# Patient Record
Sex: Male | Born: 1950
Health system: Southern US, Community
[De-identification: ages and names within clinical notes are randomized; demographics above are authoritative.]

## PROBLEM LIST (undated history)

## (undated) DIAGNOSIS — C4490 Unspecified malignant neoplasm of skin, unspecified: Secondary | ICD-10-CM

## (undated) DIAGNOSIS — K7689 Other specified diseases of liver: Secondary | ICD-10-CM

## (undated) DIAGNOSIS — I219 Acute myocardial infarction, unspecified: Secondary | ICD-10-CM

## (undated) DIAGNOSIS — E785 Hyperlipidemia, unspecified: Secondary | ICD-10-CM

## (undated) DIAGNOSIS — I2510 Atherosclerotic heart disease of native coronary artery without angina pectoris: Secondary | ICD-10-CM

## (undated) DIAGNOSIS — E663 Overweight: Secondary | ICD-10-CM

## (undated) DIAGNOSIS — M549 Dorsalgia, unspecified: Secondary | ICD-10-CM

## (undated) DIAGNOSIS — E291 Testicular hypofunction: Secondary | ICD-10-CM

## (undated) DIAGNOSIS — J189 Pneumonia, unspecified organism: Secondary | ICD-10-CM

## (undated) DIAGNOSIS — E119 Type 2 diabetes mellitus without complications: Secondary | ICD-10-CM

## (undated) HISTORY — DX: Other specified diseases of liver: K76.89

## (undated) HISTORY — DX: Hyperlipidemia, unspecified: E78.5

## (undated) HISTORY — DX: Unspecified malignant neoplasm of skin, unspecified: C44.90

## (undated) HISTORY — DX: Testicular hypofunction: E29.1

## (undated) HISTORY — DX: Dorsalgia, unspecified: M54.9

## (undated) HISTORY — PX: COLONOSCOPY: SHX174

## (undated) HISTORY — DX: Acute myocardial infarction, unspecified: I21.9

## (undated) HISTORY — DX: Overweight: E66.3

## (undated) HISTORY — DX: Type 2 diabetes mellitus without complications: E11.9

## (undated) HISTORY — PX: INGUINAL HERNIA REPAIR: SUR1180

## (undated) HISTORY — DX: Atherosclerotic heart disease of native coronary artery without angina pectoris: I25.10

## (undated) HISTORY — DX: Pneumonia, unspecified organism: J18.9

---

## 1999-10-17 DIAGNOSIS — I219 Acute myocardial infarction, unspecified: Secondary | ICD-10-CM

## 1999-10-17 HISTORY — DX: Acute myocardial infarction, unspecified: I21.9

## 2000-04-15 HISTORY — PX: OTHER SURGICAL HISTORY: SHX169

## 2000-10-16 DIAGNOSIS — C4490 Unspecified malignant neoplasm of skin, unspecified: Secondary | ICD-10-CM

## 2000-10-16 HISTORY — DX: Unspecified malignant neoplasm of skin, unspecified: C44.90

## 2003-03-03 ENCOUNTER — Encounter: Admission: RE | Admit: 2003-03-03 | Discharge: 2003-03-03 | Payer: Self-pay | Admitting: Pulmonary Disease

## 2003-03-03 ENCOUNTER — Encounter: Payer: Self-pay | Admitting: Pulmonary Disease

## 2004-11-08 ENCOUNTER — Ambulatory Visit: Payer: Self-pay | Admitting: Pulmonary Disease

## 2005-03-01 ENCOUNTER — Ambulatory Visit: Payer: Self-pay | Admitting: Pulmonary Disease

## 2005-12-08 ENCOUNTER — Ambulatory Visit: Payer: Self-pay | Admitting: Pulmonary Disease

## 2005-12-19 ENCOUNTER — Ambulatory Visit: Payer: Self-pay | Admitting: Gastroenterology

## 2005-12-25 ENCOUNTER — Ambulatory Visit: Payer: Self-pay | Admitting: Gastroenterology

## 2006-01-01 ENCOUNTER — Ambulatory Visit: Payer: Self-pay | Admitting: Gastroenterology

## 2006-01-08 ENCOUNTER — Ambulatory Visit: Payer: Self-pay | Admitting: Gastroenterology

## 2006-12-07 ENCOUNTER — Ambulatory Visit: Payer: Self-pay | Admitting: Pulmonary Disease

## 2007-03-25 ENCOUNTER — Ambulatory Visit: Payer: Self-pay | Admitting: Pulmonary Disease

## 2008-03-27 ENCOUNTER — Encounter: Payer: Self-pay | Admitting: Pulmonary Disease

## 2008-03-30 DIAGNOSIS — I2510 Atherosclerotic heart disease of native coronary artery without angina pectoris: Secondary | ICD-10-CM | POA: Insufficient documentation

## 2008-03-30 DIAGNOSIS — E785 Hyperlipidemia, unspecified: Secondary | ICD-10-CM | POA: Insufficient documentation

## 2008-03-30 DIAGNOSIS — K760 Fatty (change of) liver, not elsewhere classified: Secondary | ICD-10-CM | POA: Insufficient documentation

## 2008-04-09 ENCOUNTER — Ambulatory Visit: Payer: Self-pay | Admitting: Pulmonary Disease

## 2008-04-11 DIAGNOSIS — R7989 Other specified abnormal findings of blood chemistry: Secondary | ICD-10-CM

## 2008-04-11 DIAGNOSIS — E663 Overweight: Secondary | ICD-10-CM | POA: Insufficient documentation

## 2008-07-21 ENCOUNTER — Encounter: Payer: Self-pay | Admitting: Pulmonary Disease

## 2008-07-28 ENCOUNTER — Ambulatory Visit: Payer: Self-pay | Admitting: Pulmonary Disease

## 2008-07-28 DIAGNOSIS — M545 Low back pain: Secondary | ICD-10-CM

## 2008-10-05 ENCOUNTER — Telehealth (INDEPENDENT_AMBULATORY_CARE_PROVIDER_SITE_OTHER): Payer: Self-pay | Admitting: *Deleted

## 2009-06-15 ENCOUNTER — Ambulatory Visit: Payer: Self-pay | Admitting: Pulmonary Disease

## 2010-03-18 ENCOUNTER — Telehealth: Payer: Self-pay | Admitting: Pulmonary Disease

## 2010-06-14 ENCOUNTER — Ambulatory Visit: Payer: Self-pay | Admitting: Pulmonary Disease

## 2010-06-14 DIAGNOSIS — M25519 Pain in unspecified shoulder: Secondary | ICD-10-CM

## 2010-06-15 ENCOUNTER — Encounter: Payer: Self-pay | Admitting: Pulmonary Disease

## 2010-07-04 ENCOUNTER — Ambulatory Visit: Payer: Self-pay | Admitting: Internal Medicine

## 2010-07-11 ENCOUNTER — Telehealth (INDEPENDENT_AMBULATORY_CARE_PROVIDER_SITE_OTHER): Payer: Self-pay | Admitting: *Deleted

## 2010-11-08 ENCOUNTER — Ambulatory Visit
Admission: RE | Admit: 2010-11-08 | Discharge: 2010-11-08 | Payer: Self-pay | Source: Home / Self Care | Attending: Pulmonary Disease | Admitting: Pulmonary Disease

## 2010-11-15 NOTE — Assessment & Plan Note (Signed)
Summary: Acute NP office visit - sinus inf   CC:  pain  in left sinuses/teeth/ear, HA, and PND with runny green mucus x4days.  History of Present Illness: 60 year old male with known history of CAD, DM and Hyperlipidemia   60 y/o WM here for a follow up visit... he has multiple medical problems as noted below...    ~  Aug10:  he states that he has been doing great over the last year- "not even a cold"... he has no new complaints or concerns today...    ~  2010-07-11:  he has noted some left shoulder and arm pain w/ decr ROM; but Aleve helps & rec to use regular dosing + heat> offered Ortho referral for further eval when he is ready... he is overweight but lost 8# on diet & exerc program recently- waliking, yard work, Catering manager bike, Catering manager... he remains on ASA/ Plavix/ Aten & denies angina, palpit, SOB, etc... he hasn't seen Cards since 2005 & we will arrange for f/u check...  July 04, 2010 --Presents for an acute office visit. Complains of 4 days of sinus congestion , pain, pressure., teeth pain, ear pressure, headache, fever of 101. Denies chest pain,  orthopnea, hemoptysis,  n/v/d, edema, headache., neck stiffness. exposed recently to URI by PT in rehab.  Cold meds not helping.     Medications Prior to Update: 1)  Aspirin Adult Low Strength 81 Mg  Tbec (Aspirin) .... Take 1 Tablet By Mouth Once A Day 2)  Plavix 75 Mg  Tabs (Clopidogrel Bisulfate) .... Take 1 Tablet By Mouth Once A Day 3)  Atenolol 50 Mg  Tabs (Atenolol) .... Take 1 Tablet By Mouth Once A Day 4)  Lipitor 80 Mg  Tabs (Atorvastatin Calcium) .... Take 1/2 Tablet By Mouth Once Daily 5)  Metformin Hcl 500 Mg Tabs (Metformin Hcl) .Marland Kitchen.. 1 By Mouth Twice A Day 6)  Glimepiride 2 Mg  Tabs (Glimepiride) .... Take 1/2  Tablet By Mouth Once A Day... 7)  Androgel 50 Mg/5gm Gel (Testosterone) .... Apply As Directed (One 5gm Packet Rubbed Into Skin Daily)  Current Medications (verified): 1)  Aspirin Adult Low Strength 81 Mg  Tbec  (Aspirin) .... Take 1 Tablet By Mouth Once A Day 2)  Plavix 75 Mg  Tabs (Clopidogrel Bisulfate) .... Take 1 Tablet By Mouth Once A Day 3)  Atenolol 50 Mg  Tabs (Atenolol) .... Take 1 Tablet By Mouth Once A Day 4)  Lipitor 80 Mg  Tabs (Atorvastatin Calcium) .... Take 1/2 Tablet By Mouth Once Daily 5)  Metformin Hcl 500 Mg Tabs (Metformin Hcl) .Marland Kitchen.. 1 By Mouth Twice A Day 6)  Glimepiride 2 Mg  Tabs (Glimepiride) .... Take 1/2  Tablet By Mouth Once A Day... 7)  Androgel 50 Mg/5gm Gel (Testosterone) .... Apply As Directed (One 5gm Packet Rubbed Into Skin Daily)  Allergies (verified): No Known Drug Allergies  Past History:  Past Medical History: Last updated: 07/11/2010 CAD (ICD-414.00) HYPERLIPIDEMIA (ICD-272.4) DIABETES MELLITUS (ICD-250.00) OVERWEIGHT (ICD-278.02) FATTY LIVER DISEASE (ICD-571.8) HYPOGONADISM, MALE (ICD-257.2) BACK PAIN (ICD-724.5)  Past Surgical History: Last updated: 2010-07-11 S/P right inguinal hernia repair  Family History: Last updated: Jul 11, 2010 Father died age 70 w/ DM complic- vasc dis/ amputation, coma. Mother died age 45 from alcoholism, stroke. No siblings  Social History: Last updated: 2010/07/11 Married, wife= Debbie, 84yrs. 1 son never smoked exercises daily by walking no caffeine use no alcohol use Employ: fork lift operator  Risk Factors: Smoking Status: never (07-11-2010)  Review of Systems      See HPI  Vital Signs:  Patient profile:   60 year old male Height:      72 inches Weight:      223.50 pounds BMI:     30.42 O2 Sat:      96 % on Room air Temp:     101.2 degrees F oral Pulse rate:   83 / minute BP sitting:   134 / 76  (left arm) Cuff size:   regular  Vitals Entered By: Boone Master CNA/MA (July 04, 2010 2:33 PM)  O2 Flow:  Room air CC: pain  in left sinuses/teeth/ear, HA, PND with runny green mucus x4days Is Patient Diabetic? Yes Comments Medications reviewed with patient Daytime contact number verified  with patient. Boone Master CNA/MA  July 04, 2010 2:33 PM    Physical Exam  Additional Exam:  WD, Overweight, 60 y/o WM in NAD... GENERAL:  Alert & oriented; pleasant & cooperative... HEENT:  [place]/AT, EOM-wnl, PERRLA, EACs-clear, TMs-wnl, NOSE-red, swollen mucosa, max sinus tenderness  THROAT-clear & wnl. NECK:  Supple w/ full ROM; no JVD; normal carotid impulses w/o bruits; no thyromegaly or nodules palpated; no lymphadenopathy. CHEST:  Clear to P & A; without wheezes/ rales/ or rhonchi. HEART:  Regular Rhythm; without murmurs/ rubs/ or gallops. ABDOMEN:  Soft & nontender; normal bowel sounds; no organomegaly or masses detected.  EXT: without deformities, mild arthritic changes; no varicose veins/ venous insuffic/ or edema.      Impression & Recommendations:  Problem # 1:  SINUSITIS, ACUTE (ICD-461.9)  Acute flare of sinusitis Plan Saline nasal rinses as needed  Tyenol as needed pain increaes fluids and rest.  Zpack take as directed.  Please contact office for sooner follow up if symptoms do not improve or worsen  His updated medication list for this problem includes:    Zithromax 500 Mg Tabs (Azithromycin) .Marland Kitchen... 1 by mouth once daily  Orders: Est. Patient Level IV (06269)  Medications Added to Medication List This Visit: 1)  Zithromax 500 Mg Tabs (Azithromycin) .Marland Kitchen.. 1 by mouth once daily  Complete Medication List: 1)  Aspirin Adult Low Strength 81 Mg Tbec (Aspirin) .... Take 1 tablet by mouth once a day 2)  Plavix 75 Mg Tabs (Clopidogrel bisulfate) .... Take 1 tablet by mouth once a day 3)  Atenolol 50 Mg Tabs (Atenolol) .... Take 1 tablet by mouth once a day 4)  Lipitor 80 Mg Tabs (Atorvastatin calcium) .... Take 1/2 tablet by mouth once daily 5)  Metformin Hcl 500 Mg Tabs (Metformin hcl) .Marland Kitchen.. 1 by mouth twice a day 6)  Glimepiride 2 Mg Tabs (Glimepiride) .... Take 1/2  tablet by mouth once a day... 7)  Androgel 50 Mg/5gm Gel (Testosterone) .... Apply as directed  (one 5gm packet rubbed into skin daily) 8)  Zithromax 500 Mg Tabs (Azithromycin) .Marland Kitchen.. 1 by mouth once daily  Patient Instructions: 1)  Saline nasal rinses as needed  2)  Tyenol as needed pain 3)  increaes fluids and rest.  4)  Zpack take as directed.  5)  Please contact office for sooner follow up if symptoms do not improve or worsen  Prescriptions: ZITHROMAX 500 MG TABS (AZITHROMYCIN) 1 by mouth once daily  #5 x 0   Entered and Authorized by:   Rubye Oaks NP   Signed by:   Tammy Parrett NP on 07/04/2010   Method used:   Electronically to        [facility]  [address] [phone] * (retail)       [address]       [place], [place]  [address]       Ph: [phone] or [phone]       Fax: [phone]   RxID:   [id number]

## 2010-11-15 NOTE — Progress Notes (Signed)
Summary: rx  Phone Note Call from Patient Call back at Home Phone [phone]   Caller: Patient Call For: Symone Cornman Reason for Call: Talk to Nurse Summary of Call: Andrio gel 5g needs to be called in to [facility]. Initial call taken by: Eugene Gavia,  March 18, 2010 2:47 PM  Follow-up for Phone Call        rx pritned and placed on SN look-at to sign. will fax once signed. Carron Curie CMA  March 18, 2010 3:01 PM  rx faxed. pt advised. Carron Curie CMA  March 18, 2010 3:18 PM     Prescriptions: ANDROGEL 50 MG/5GM GEL (TESTOSTERONE) apply as directed (one 5gm packet rubbed into skin daily)  #90 x 3   Entered by:   Carron Curie CMA   Authorized by:   Michele Mcalpine MD   Signed by:   Carron Curie CMA on 03/18/2010   Method used:   Printed then faxed to ...       [facility] education [facility])       [address] [address]       [place], [place]  [address]       Ph: [phone]       Fax: [phone]   RxID:   [id number]

## 2010-11-15 NOTE — Progress Notes (Signed)
Summary: no energy  Phone Note Call from Patient   Caller: Patient Call For: nadel Summary of Call: pt completed z pak  . have no energy would like to talk to nurse Initial call taken by: Rickard Patience,  July 11, 2010 3:48 PM  Follow-up for Phone Call        Spoke with pt's spouse.  She states that pt was seen on 9/19 by TP for sinus infection and txed with zithromax.  She states that all of his sinus problems resolved, but now c/o no energy at all.  She states that he has not left his home in 1 wk and this is not like him.  Would like recs per SN.  Pt was seen by SN on 06/14/10 for cpx.  Pls advise thanks Follow-up by: Vernie Murders,  July 11, 2010 4:02 PM  Additional Follow-up for Phone Call Additional follow up Details #1::        per SN----all labs were normal on 06/14/2010       it will just take some time---recs are to rest at home, increase fluids, slowly increase activity.  thanks Randell Loop CMA  July 11, 2010 4:31 PM     Additional Follow-up for Phone Call Additional follow up Details #2::    Spoke with pt's spouse and notified of recs per SN.  She verbalized understanding. Follow-up by: Vernie Murders,  July 11, 2010 4:44 PM

## 2010-11-15 NOTE — Assessment & Plan Note (Signed)
Summary: rov 1 yr ////kp   CC:  Yearly ROV & CPX....  History of Present Illness: 60 y/o WM here for a follow up visit... he has multiple medical problems as noted below...    ~  Aug10:  he states that he has been doing great over the last year- "not even a cold"... he has no new complaints or concerns today...    ~  June 14, 2010:  he has noted some left shoulder and arm pain w/ decr ROM; but Aleve helps & rec to use regular dosing + heat> offered Ortho referral for further eval when he is ready... he is overweight but lost 8# on diet & exerc program recently- waliking, yard work, Catering manager bike, Catering manager... he remains on ASA/ Plavix/ Aten & denies angina, palpit, SOB, etc... he hasn't seen Cards since 2005 & we will arrange for f/u check...   Current Problem List:  CAD (ICD-414.00) - presented w/ CP in 2001 w/ IWMI and cath showing a 90% RCA lesion- s/p PTCA/ stent... he also had some mild LAD dis 20-30% at that time... seen 2005 by DrCrenshaw... he is on ASA 81mg /d, PLAVIX 75mg /d, & ATENOLOL 50mg /d...  ~  NuclearStressTest 7/05 showed inferolat infarct, no ischemia, impaired exerc capacity, EF=60%.   ~  8/10:  doing well- denies CP, palpit, SOB... walking daily w/o problems...  ~  8/11: he remains asymptomatic but I have encouraged Cards f/u appt.  HYPERLIPIDEMIA (ICD-272.4) - on LIPITOR 80mg - taking 1/2 tab daily... Labs all done at [facility]>  ~  FLP 6/08 showed TChol 142, TG 207, HDL 32, LDL 69... told to work on diet & weight...  ~  FLP 6/09 at [facility] showed TChol 140, TG 144, HDL 32, LDL 79... improved, continue Lipitor + diet.  ~  FLP 8/10 at [facility] showed TChol 145, TG 167, HDL 33, LDL 79  ~  FLP 8/11 at [facility] showed TChol =   DIABETES MELLITUS (ICD-250.00) - on METFORMIN 500mg Bid, and GLIMEPIRIDE 2mg /d...   ~  labs 6/08 showed BS= 161, HgA1c= not done by [facility]...  ~  labs 6/09 showed BS= 90, HgA1c= 6.0.Marland Kitchen. stay on diet, and decr the Gliepiride to 1/2 tab Qam...  ~  8/10:  states  BS at home are "OK"; labs at [facility] showed BS= 94, A1c not done.  ~  8/11: states BS are OK when he checks them, [facility] labs showed BS=   OVERWEIGHT (ICD-278.02) - we reviewed diet+exercise needed for weight reduction.  ~  weight in the 2000 decade varied betw 225-250.Marland Kitchen.  ~  weight 6/09 = 231#  ~  weight 8/10 = 237#  ~  weight 8/11 = 229#  COLONOSCOPY - we will check old chart for details...  FATTY LIVER DISEASE (ICD-571.8) - LFT's sl elevated in the past...   ~  LFT's on labs since 2008 have been WNL...  HYPOGONADISM, MALE (ICD-257.2) - he has atrophic testes and low testosterone levels... on Rx w/ ANDROGEL 5gm/d rubbed into skin as directed... testosterone level 177 (350-900) in 2007... we haven't been able to get a [facility] f/u level since then, but clinically he feels much better, energy level is good... he also notes ED> but not using PDE5 inhibitors etc...  ~  Labs at [facility] 8/10 showed Testosterone = 171 (280-800) & rec to use the Angdrogel daily; PSA= 0.4  ~  labs at [facility] 8/11 showed Testosterone level =   Health Maintenance - s/p Tetanus shot in 2008... s/p Pneumovax 6/09... he is  a never smoker... quit Etoh in the 70's... works as a Lobbyist in a [employer]...    Preventive Screening-Counseling & Management  Alcohol-Tobacco     Smoking Status: never  Allergies (verified): No Known Drug Allergies  Comments:  Nurse/Medical Assistant: The patient's medications and allergies were reviewed with the patient and were updated in the Medication and Allergy Lists.  Past History:  Past Medical History: CAD (ICD-414.00) HYPERLIPIDEMIA (ICD-272.4) DIABETES MELLITUS (ICD-250.00) OVERWEIGHT (ICD-278.02) FATTY LIVER DISEASE (ICD-571.8) HYPOGONADISM, MALE (ICD-257.2) BACK PAIN (ICD-724.5)  Past Surgical History: S/P right inguinal hernia repair  Family History: Reviewed history from 06/15/2009 and no changes required. Father died age 70 w/ DM complic- vasc dis/  amputation, coma. Mother died age 66 from alcoholism, stroke. No siblings  Social History: Reviewed history from 06/15/2009 and no changes required. Married, wife= Debbie, 17yrs. 1 son never smoked exercises daily by walking no caffeine use no alcohol use Employ: fork lift operator  Review of Systems       The patient complains of joint pain and stiffness.  The patient denies fever, chills, sweats, anorexia, fatigue, weakness, malaise, weight loss, sleep disorder, blurring, diplopia, eye irritation, eye discharge, vision loss, eye pain, photophobia, earache, ear discharge, tinnitus, decreased hearing, nasal congestion, nosebleeds, sore throat, hoarseness, chest pain, palpitations, syncope, dyspnea on exertion, orthopnea, PND, peripheral edema, cough, dyspnea at rest, excessive sputum, hemoptysis, wheezing, pleurisy, nausea, vomiting, diarrhea, constipation, change in bowel habits, abdominal pain, melena, hematochezia, jaundice, gas/bloating, indigestion/heartburn, dysphagia, odynophagia, dysuria, hematuria, urinary frequency, urinary hesitancy, nocturia, incontinence, back pain, joint swelling, muscle cramps, muscle weakness, arthritis, sciatica, restless legs, leg pain at night, leg pain with exertion, rash, itching, dryness, suspicious lesions, paralysis, paresthesias, seizures, tremors, vertigo, transient blindness, frequent falls, frequent headaches, difficulty walking, depression, anxiety, memory loss, confusion, cold intolerance, heat intolerance, polydipsia, polyphagia, polyuria, unusual weight change, abnormal bruising, bleeding, enlarged lymph nodes, urticaria, allergic rash, hay fever, and recurrent infections.    Vital Signs:  Patient profile:   60 year old male Height:      72 inches Weight:      229.25 pounds BMI:     31.20 O2 Sat:      100 % on Room air Temp:     97.2 degrees F oral Pulse rate:   69 / minute BP sitting:   110 / 70  (left arm) Cuff size:   regular  Vitals  Entered By: Randell Loop CMA (June 14, 2010 8:48 AM)  O2 Sat at Rest %:  100 O2 Flow:  Room air CC: Yearly ROV & CPX... Is Patient Diabetic? Yes Pain Assessment Patient in pain? yes      Onset of pain  left shoulder x 3-4 month Comments no changes in meds today   Physical Exam  Additional Exam:  WD, Overweight, 60 y/o WM in NAD... GENERAL:  Alert & oriented; pleasant & cooperative... HEENT:  [place]/AT, EOM-wnl, PERRLA, EACs-clear, TMs-wnl, NOSE-clear, THROAT-clear & wnl. NECK:  Supple w/ full ROM; no JVD; normal carotid impulses w/o bruits; no thyromegaly or nodules palpated; no lymphadenopathy. CHEST:  Clear to P & A; without wheezes/ rales/ or rhonchi. HEART:  Regular Rhythm; without murmurs/ rubs/ or gallops. ABDOMEN:  Soft & nontender; normal bowel sounds; no organomegaly or masses detected. RECTAL:  Neg - prostate 2+ rounded & nontender w/o nodules; stool hematest neg; atophic testes bilat... EXT: without deformities, mild arthritic changes; no varicose veins/ venous insuffic/ or edema. decr ROM left shoulder noted... NEURO:  CN's intact; motor  testing normal; sensory testing normal; gait normal & balance OK. DERM:  mild intertrig rash noted...    MISC. Report  Procedure date:  06/14/2010  Findings:      He will have fasting labs done at [facility] & fax the results to [place] to review...  SN   Impression & Recommendations:  Problem # 1:  PHYSICAL EXAMINATION (ICD-V70.0)  Problem # 2:  CAD (ICD-414.00) He hasn't seen Cards in 6 yrs and needs f/u eval... His updated medication list for this problem includes:    Aspirin Adult Low Strength 81 Mg Tbec (Aspirin) .Marland Kitchen... Take 1 tablet by mouth once a day    Plavix 75 Mg Tabs (Clopidogrel bisulfate) .Marland Kitchen... Take 1 tablet by mouth once a day    Atenolol 50 Mg Tabs (Atenolol) .Marland Kitchen... Take 1 tablet by mouth once a day  Orders: Cardiology Referral (Cardiology)  Problem # 3:  HYPERLIPIDEMIA (ICD-272.4) FLP from [facility] have been good  on Lip40... continue same + better diet, get wt down. His updated medication list for this problem includes:    Lipitor 80 Mg Tabs (Atorvastatin calcium) .Marland Kitchen... Take 1/2 tablet by mouth once daily  Problem # 4:  DIABETES MELLITUS (ICD-250.00) He is stable on meds... needs weight reduction program, etc... His updated medication list for this problem includes:    Aspirin Adult Low Strength 81 Mg Tbec (Aspirin) .Marland Kitchen... Take 1 tablet by mouth once a day    Metformin Hcl 500 Mg Tabs (Metformin hcl) .Marland Kitchen... 1 by mouth twice a day    Glimepiride 2 Mg Tabs (Glimepiride) .Marland Kitchen... Take 1/2  tablet by mouth once a day...  Problem # 5:  OVERWEIGHT (ICD-278.02) Weight loss is key...  Problem # 6:  HYPOGONADISM, MALE (ICD-257.2) He remains on Androgel> feeling well, energy good, despite sl low Testos level when checked... OK to continue as is.  Problem # 7:  BACK PAIN (ICD-724.5) We discussed rest, heat, OTC anti-inflamm Rx...refer to Ortho if nec. His updated medication list for this problem includes:    Aspirin Adult Low Strength 81 Mg Tbec (Aspirin) .Marland Kitchen... Take 1 tablet by mouth once a day  Complete Medication List: 1)  Aspirin Adult Low Strength 81 Mg Tbec (Aspirin) .... Take 1 tablet by mouth once a day 2)  Plavix 75 Mg Tabs (Clopidogrel bisulfate) .... Take 1 tablet by mouth once a day 3)  Atenolol 50 Mg Tabs (Atenolol) .... Take 1 tablet by mouth once a day 4)  Lipitor 80 Mg Tabs (Atorvastatin calcium) .... Take 1/2 tablet by mouth once daily 5)  Metformin Hcl 500 Mg Tabs (Metformin hcl) .Marland Kitchen.. 1 by mouth twice a day 6)  Glimepiride 2 Mg Tabs (Glimepiride) .... Take 1/2  tablet by mouth once a day... 7)  Androgel 50 Mg/5gm Gel (Testosterone) .... Apply as directed (one 5gm packet rubbed into skin daily)  Other Orders: Orthopedic Referral (Ortho)  Patient Instructions: 1)  Today we updated your med list- see below.... 2)  We refilled your meds for 90d supplies.Marland KitchenMarland Kitchen 3)  Please have your FASTING blood  work done at [facility] & fax'd to [place] to review... we will record a phone tree message w/ your results.Marland KitchenMarland Kitchen 4)  Let's get on track w/ our diet + exercise program w/ a goal of losing 15-20 lbs... 5)  You are overdue for a follow up appt w/ Cardiology 7 wee will set this up for you... 6)  Call for any problems.Marland KitchenMarland Kitchen 7)  Please schedule a follow-up appointment in 1 year.  Prescriptions: ANDROGEL 50 MG/5GM GEL (TESTOSTERONE) apply as directed (one 5gm packet rubbed into skin daily)  #90 x 4   Entered and Authorized by:   Michele Mcalpine MD   Signed by:   Michele Mcalpine MD on 06/14/2010   Method used:   Print then Give to Patient   RxID:   [id number] GLIMEPIRIDE 2 MG  TABS (GLIMEPIRIDE) Take 1/2  tablet by mouth once a day...  #90 x 4   Entered and Authorized by:   Michele Mcalpine MD   Signed by:   Michele Mcalpine MD on 06/14/2010   Method used:   Print then Give to Patient   RxID:   [id number] METFORMIN HCL 500 MG TABS (METFORMIN HCL) 1 by mouth twice a day  #180 x 4   Entered and Authorized by:   Michele Mcalpine MD   Signed by:   Michele Mcalpine MD on 06/14/2010   Method used:   Print then Give to Patient   RxID:   [id number] LIPITOR 80 MG  TABS (ATORVASTATIN CALCIUM) take 1/2 tablet by mouth once daily  #90 x 4   Entered and Authorized by:   Michele Mcalpine MD   Signed by:   Michele Mcalpine MD on 06/14/2010   Method used:   Print then Give to Patient   RxID:   [id number] ATENOLOL 50 MG  TABS (ATENOLOL) Take 1 tablet by mouth once a day  #90 x 4   Entered and Authorized by:   Michele Mcalpine MD   Signed by:   Michele Mcalpine MD on 06/14/2010   Method used:   Print then Give to Patient   RxID:   [id number] PLAVIX 75 MG  TABS (CLOPIDOGREL BISULFATE) Take 1 tablet by mouth once a day  #90 x 4   Entered and Authorized by:   Michele Mcalpine MD   Signed by:   Michele Mcalpine MD on 06/14/2010   Method used:   Print then Give to Patient   RxID:   [id number]    Immunization  History:  Influenza Immunization History:    Influenza:  historical (08/05/2009)

## 2010-11-17 NOTE — Assessment & Plan Note (Signed)
Summary: Acute NP office visit - bronchitis   CC:  dry hacking cough x10days - went to UC at onset of symptoms and was given zpak, mucinex dm and tussin syrup.  states symptoms exacerbated 3 days ago w/ cough occ producing yellow mucus, bilateral ear disocmfort.  denies wheezing, SOB, and f/c/s.  History of Present Illness: 60 year old male with known history of CAD, DM and Hyperlipidemia   ~  Aug10:  he states that he has been doing great over the last year- "not even a cold"... he has no new complaints or concerns today...    ~  June 14, 2010:  he has noted some left shoulder and arm pain w/ decr ROM; but Aleve helps & rec to use regular dosing + heat> offered Ortho referral for further eval when he is ready... he is overweight but lost 8# on diet & exerc program recently- waliking, yard work, Catering manager bike, Catering manager... he remains on ASA/ Plavix/ Aten & denies angina, palpit, SOB, etc... he hasn't seen Cards since 2005 & we will arrange for f/u check...  July 04, 2010 --Presents for an acute office visit. Complains of 4 days of sinus congestion , pain, pressure., teeth pain, ear pressure, headache, fever of 101. Denies chest pain,  orthopnea, hemoptysis,  n/v/d, edema, headache., neck stiffness. exposed recently to URI by PT in rehab.  Cold meds not helping.>>tx w/ zpack  November 08, 2010--Presents for an acute office visit. Complains of dry hacking cough x10days - went to UC at onset of symptoms and was given zpak, mucinex dm and tussin syrup.  Improved some but  symptoms exacerbated 3 days ago w/ cough occ producing yellow mucus, bilateral ear disocmfort.  Wife has had similar symptoms.Denies chest pain,  orthopnea, hemoptysis, fever, n/v/d, edema, headache.    Medications Prior to Update: 1)  Aspirin Adult Low Strength 81 Mg  Tbec (Aspirin) .... Take 1 Tablet By Mouth Once A Day 2)  Plavix 75 Mg  Tabs (Clopidogrel Bisulfate) .... Take 1 Tablet By Mouth Once A Day 3)  Atenolol 50 Mg  Tabs  (Atenolol) .... Take 1 Tablet By Mouth Once A Day 4)  Lipitor 80 Mg  Tabs (Atorvastatin Calcium) .... Take 1/2 Tablet By Mouth Once Daily 5)  Metformin Hcl 500 Mg Tabs (Metformin Hcl) .Marland Kitchen.. 1 By Mouth Twice A Day 6)  Glimepiride 2 Mg  Tabs (Glimepiride) .... Take 1/2  Tablet By Mouth Once A Day... 7)  Androgel 50 Mg/5gm Gel (Testosterone) .... Apply As Directed (One 5gm Packet Rubbed Into Skin Daily) 8)  Zithromax 500 Mg Tabs (Azithromycin) .Marland Kitchen.. 1 By Mouth Once Daily  Current Medications (verified): 1)  Aspirin Adult Low Strength 81 Mg  Tbec (Aspirin) .... Take 1 Tablet By Mouth Once A Day 2)  Plavix 75 Mg  Tabs (Clopidogrel Bisulfate) .... Take 1 Tablet By Mouth Once A Day 3)  Atenolol 50 Mg  Tabs (Atenolol) .... Take 1 Tablet By Mouth Once A Day 4)  Lipitor 80 Mg  Tabs (Atorvastatin Calcium) .... Take 1/2 Tablet By Mouth Once Daily 5)  Metformin Hcl 500 Mg Tabs (Metformin Hcl) .Marland Kitchen.. 1 By Mouth Twice A Day 6)  Glimepiride 2 Mg  Tabs (Glimepiride) .... Take 1/2  Tablet By Mouth Once A Day... 7)  Androgel 50 Mg/5gm Gel (Testosterone) .... Apply As Directed (One 5gm Packet Rubbed Into Skin Daily)  Allergies (verified): No Known Drug Allergies  Past History:  Past Medical History: Last updated: 06/14/2010 CAD (ICD-414.00) HYPERLIPIDEMIA (  ICD-272.4) DIABETES MELLITUS (ICD-250.00) OVERWEIGHT (ICD-278.02) FATTY LIVER DISEASE (ICD-571.8) HYPOGONADISM, MALE (ICD-257.2) BACK PAIN (ICD-724.5)  Past Surgical History: Last updated: 2010/06/24 S/P right inguinal hernia repair  Family History: Last updated: 2010-06-24 Father died age 26 w/ DM complic- vasc dis/ amputation, coma. Mother died age 55 from alcoholism, stroke. No siblings  Social History: Last updated: 06-24-2010 Married, wife= Debbie, 25yrs. 1 son never smoked exercises daily by walking no caffeine use no alcohol use Employ: fork lift operator  Risk Factors: Smoking Status: never (2010/06/24)  Review of Systems       See HPI  Vital Signs:  Patient profile:   60 year old male Height:      72 inches Weight:      233.38 pounds BMI:     31.77 O2 Sat:      94 % on Room air Temp:     97.7 degrees F oral Pulse rate:   69 / minute BP sitting:   108 / 64  (left arm) Cuff size:   regular  Vitals Entered By: Boone Master CNA/MA (November 08, 2010 2:01 PM)  O2 Flow:  Room air CC: dry hacking cough x10days - went to UC at onset of symptoms and was given zpak, mucinex dm and tussin syrup.  states symptoms exacerbated 3 days ago w/ cough occ producing yellow mucus, bilateral ear disocmfort.  denies wheezing, SOB, f/c/s Is Patient Diabetic? Yes Comments Medications reviewed with patient Daytime contact number verified with patient. Boone Master CNA/MA  November 08, 2010 1:56 PM    Physical Exam  Additional Exam:  WD, Overweight, 60 y/o WM in NAD... GENERAL:  Alert & oriented; pleasant & cooperative... HEENT:  [place]/AT, EOM-wnl, PERRLA, EACs-clear, TMs-wnl, NOSE clear drainage   THROAT-clear & wnl. NECK:  Supple w/ full ROM; no JVD; normal carotid impulses w/o bruits; no thyromegaly or nodules palpated; no lymphadenopathy. CHEST:  Clear to P & A; without wheezes/ rales/ or rhonchi. HEART:  Regular Rhythm; without murmurs/ rubs/ or gallops. ABDOMEN:  Soft & nontender; normal bowel sounds; no organomegaly or masses detected.  EXT: without deformities, mild arthritic changes; no varicose veins/ venous insuffic/ or edema.      Impression & Recommendations:  Problem # 1:  BRONCHITIS, ACUTE (ICD-466.0)  Slow to resolve  Plan:  Omnicef 300mg  two times a day for 7 days Mucinex DM two times a day as needed cough/congestion  Hydromet 1-2 tsp every 4-6 hrs as needed cough--may make you sleepy Prednisone taper over next week.  Increase fluids and rest  Please contact office for sooner follow up if symptoms do not improve or worsen  The following medications were removed from the medication list:    Zithromax  500 Mg Tabs (Azithromycin) .Marland Kitchen... 1 by mouth once daily His updated medication list for this problem includes:    Cefdinir 300 Mg Caps (Cefdinir) .Marland Kitchen... 1 by mouth two times a day    Hydromet 5-1.5 Mg/20ml Syrp (Hydrocodone-homatropine) .Marland Kitchen... 1-2 tsp every 4-6 hrs as needed cough  Orders: Est. Patient Level IV (04540)  Medications Added to Medication List This Visit: 1)  Cefdinir 300 Mg Caps (Cefdinir) .Marland Kitchen.. 1 by mouth two times a day 2)  Prednisone 10 Mg Tabs (Prednisone) .... 4 tabs for 2 days, then 3 tabs for 2 days, 2 tabs for 2 days, then 1 tab for 2 days, then stop 3)  Hydromet 5-1.5 Mg/66ml Syrp (Hydrocodone-homatropine) .Marland Kitchen.. 1-2 tsp every 4-6 hrs as needed cough  Complete Medication List: 1)  Aspirin  Adult Low Strength 81 Mg Tbec (Aspirin) .... Take 1 tablet by mouth once a day 2)  Plavix 75 Mg Tabs (Clopidogrel bisulfate) .... Take 1 tablet by mouth once a day 3)  Atenolol 50 Mg Tabs (Atenolol) .... Take 1 tablet by mouth once a day 4)  Lipitor 80 Mg Tabs (Atorvastatin calcium) .... Take 1/2 tablet by mouth once daily 5)  Metformin Hcl 500 Mg Tabs (Metformin hcl) .Marland Kitchen.. 1 by mouth twice a day 6)  Glimepiride 2 Mg Tabs (Glimepiride) .... Take 1/2  tablet by mouth once a day... 7)  Androgel 50 Mg/5gm Gel (Testosterone) .... Apply as directed (one 5gm packet rubbed into skin daily) 8)  Cefdinir 300 Mg Caps (Cefdinir) .Marland Kitchen.. 1 by mouth two times a day 9)  Prednisone 10 Mg Tabs (Prednisone) .... 4 tabs for 2 days, then 3 tabs for 2 days, 2 tabs for 2 days, then 1 tab for 2 days, then stop 10)  Hydromet 5-1.5 Mg/30ml Syrp (Hydrocodone-homatropine) .Marland Kitchen.. 1-2 tsp every 4-6 hrs as needed cough  Patient Instructions: 1)  Omnicef 300mg  two times a day for 7 days 2)  Mucinex DM two times a day as needed cough/congestion  3)  Hydromet 1-2 tsp every 4-6 hrs as needed cough--may make you sleepy 4)  Prednisone taper over next week.  5)  Increase fluids and rest  6)  Please contact office for sooner  follow up if symptoms do not improve or worsen  Prescriptions: HYDROMET 5-1.5 MG/5ML SYRP (HYDROCODONE-HOMATROPINE) 1-2 tsp every 4-6 hrs as needed cough  #8 oz x 0   Entered and Authorized by:   Rubye Oaks NP   Signed by:   Miranda Frese NP on 11/08/2010   Method used:   Print then Give to Patient   RxID:   [id number] PREDNISONE 10 MG TABS (PREDNISONE) 4 tabs for 2 days, then 3 tabs for 2 days, 2 tabs for 2 days, then 1 tab for 2 days, then stop  #20 x 0   Entered and Authorized by:   Rubye Oaks NP   Signed by:   Thomasa Heidler NP on 11/08/2010   Method used:   Electronically to        [id number] (retail)       [address]       [place], [place]  [address]       Ph: [phone] or [phone]       Fax: [phone]   RxID:   [id number] CEFDINIR 300 MG CAPS (CEFDINIR) 1 by mouth two times a day  #14 x 0   Entered and Authorized by:   Rubye Oaks NP   Signed by:   Caryn Gienger NP on 11/08/2010   Method used:   [facility] to        [id number] (retail)       [address]       [place], [place]  [address]       Ph: [phone] or [phone]       Fax: [phone]   RxID:   [id number]    Immunization History:  Influenza Immunization History:    Influenza:  historical (07/16/2010)

## 2010-11-21 ENCOUNTER — Ambulatory Visit (INDEPENDENT_AMBULATORY_CARE_PROVIDER_SITE_OTHER)
Admission: RE | Admit: 2010-11-21 | Discharge: 2010-11-21 | Disposition: A | Discharge status: Home / Self Care | Payer: 59 | Source: Ambulatory Visit | Attending: Pulmonary Disease | Admitting: Pulmonary Disease

## 2010-11-21 ENCOUNTER — Other Ambulatory Visit: Payer: Self-pay | Admitting: Pulmonary Disease

## 2010-11-21 ENCOUNTER — Ambulatory Visit (INDEPENDENT_AMBULATORY_CARE_PROVIDER_SITE_OTHER): Payer: 59 | Admitting: Adult Health

## 2010-11-21 ENCOUNTER — Encounter: Payer: Self-pay | Admitting: Adult Health

## 2010-11-21 DIAGNOSIS — J209 Acute bronchitis, unspecified: Secondary | ICD-10-CM

## 2010-11-24 ENCOUNTER — Encounter: Payer: Self-pay | Admitting: Adult Health

## 2010-11-29 ENCOUNTER — Telehealth (INDEPENDENT_AMBULATORY_CARE_PROVIDER_SITE_OTHER): Payer: Self-pay | Admitting: *Deleted

## 2010-12-01 NOTE — Assessment & Plan Note (Signed)
Summary: NP follow up - bronchitis   CC:  prod cough with green mucus and fatigue - states no better since last ov.  finished abx and pred taper 6days ago.  History of Present Illness: 60 year old male with known history of CAD, DM and Hyperlipidemia   ~  Aug10:  he states that he has been doing great over the last year- "not even a cold"... he has no new complaints or concerns today...    ~  June 14, 2010:  he has noted some left shoulder and arm pain w/ decr ROM; but Aleve helps & rec to use regular dosing + heat> offered Ortho referral for further eval when he is ready... he is overweight but lost 8# on diet & exerc program recently- waliking, yard work, Catering manager bike, Catering manager... he remains on ASA/ Plavix/ Aten & denies angina, palpit, SOB, etc... he hasn't seen Cards since 2005 & we will arrange for f/u check...  July 04, 2010 --Presents for an acute office visit. Complains of 4 days of sinus congestion , pain, pressure., teeth pain, ear pressure, headache, fever of 101. Denies chest pain,  orthopnea, hemoptysis,  n/v/d, edema, headache., neck stiffness. exposed recently to URI by PT in rehab.  Cold meds not helping.>>tx w/ zpack  November 08, 2010--Presents for an acute office visit. Complains of dry hacking cough x10days - went to UC at onset of symptoms and was given zpak, mucinex dm and tussin syrup.  Improved some but  symptoms exacerbated 3 days ago w/ cough occ producing yellow mucus, bilateral ear disocmfort.  Wife has had similar symptoms.Denies chest pain,  orthopnea, hemoptysis, fever, n/v/d, edema, headache.  11/21/10 --Returns for persistent cough. Complains of prod cough with green mucus, fatigue - states no better since last ov. Seen 2 weeks ago tx w/  Omnicef and sterod burst. He returns saying congestion is no discolroed but cough wheezing are no better. He has now had 2 abx without significant improvement.  Denies chest pain, orthopnea, hemoptysis, fever, n/v/d, edema, headache/    Medications Prior to Update: 1)  Aspirin Adult Low Strength 81 Mg  Tbec (Aspirin) .... Take 1 Tablet By Mouth Once A Day 2)  Plavix 75 Mg  Tabs (Clopidogrel Bisulfate) .... Take 1 Tablet By Mouth Once A Day 3)  Atenolol 50 Mg  Tabs (Atenolol) .... Take 1 Tablet By Mouth Once A Day 4)  Lipitor 80 Mg  Tabs (Atorvastatin Calcium) .... Take 1/2 Tablet By Mouth Once Daily 5)  Metformin Hcl 500 Mg Tabs (Metformin Hcl) .Marland Kitchen.. 1 By Mouth Twice A Day 6)  Glimepiride 2 Mg  Tabs (Glimepiride) .... Take 1/2  Tablet By Mouth Once A Day... 7)  Androgel 50 Mg/5gm Gel (Testosterone) .... Apply As Directed (One 5gm Packet Rubbed Into Skin Daily) 8)  Cefdinir 300 Mg Caps (Cefdinir) .Marland Kitchen.. 1 By Mouth Two Times A Day 9)  Prednisone 10 Mg Tabs (Prednisone) .... 4 Tabs For 2 Days, Then 3 Tabs For 2 Days, 2 Tabs For 2 Days, Then 1 Tab For 2 Days, Then Stop 10)  Hydromet 5-1.5 Mg/69ml Syrp (Hydrocodone-Homatropine) .Marland Kitchen.. 1-2 Tsp Every 4-6 Hrs As Needed Cough  Current Medications (verified): 1)  Aspirin Adult Low Strength 81 Mg  Tbec (Aspirin) .... Take 1 Tablet By Mouth Once A Day 2)  Plavix 75 Mg  Tabs (Clopidogrel Bisulfate) .... Take 1 Tablet By Mouth Once A Day 3)  Atenolol 50 Mg  Tabs (Atenolol) .... Take 1 Tablet By Mouth Once  A Day 4)  Lipitor 80 Mg  Tabs (Atorvastatin Calcium) .... Take 1/2 Tablet By Mouth Once Daily 5)  Metformin Hcl 500 Mg Tabs (Metformin Hcl) .Marland Kitchen.. 1 By Mouth Twice A Day 6)  Glimepiride 2 Mg  Tabs (Glimepiride) .... Take 1/2  Tablet By Mouth Once A Day... 7)  Androgel 50 Mg/5gm Gel (Testosterone) .... Apply As Directed (One 5gm Packet Rubbed Into Skin Daily) 8)  Hydromet 5-1.5 Mg/88ml Syrp (Hydrocodone-Homatropine) .Marland Kitchen.. 1-2 Tsp Every 4-6 Hrs As Needed Cough  Allergies (verified): No Known Drug Allergies  Past History:  Past Medical History: Last updated: 06/23/10 CAD (ICD-414.00) HYPERLIPIDEMIA (ICD-272.4) DIABETES MELLITUS (ICD-250.00) OVERWEIGHT (ICD-278.02) FATTY LIVER DISEASE  (ICD-571.8) HYPOGONADISM, MALE (ICD-257.2) BACK PAIN (ICD-724.5)  Past Surgical History: Last updated: Jun 23, 2010 S/P right inguinal hernia repair  Family History: Last updated: June 23, 2010 Father died age 61 w/ DM complic- vasc dis/ amputation, coma. Mother died age 65 from alcoholism, stroke. No siblings  Social History: Last updated: 06/23/2010 Married, wife= Debbie, 78yrs. 1 son never smoked exercises daily by walking no caffeine use no alcohol use Employ: fork lift operator  Risk Factors: Smoking Status: never (Jun 23, 2010)  Review of Systems      See HPI  Vital Signs:  Patient profile:   60 year old male Height:      72 inches Weight:      234.50 pounds BMI:     31.92 O2 Sat:      95 % on Room air Temp:     98.0 degrees F oral Pulse rate:   75 / minute BP sitting:   108 / 74  (left arm) Cuff size:   regular  Vitals Entered By: Boone Master CNA/MA (November 21, 2010 3:13 PM)  O2 Flow:  Room air CC: prod cough with green mucus, fatigue - states no better since last ov.  finished abx and pred taper 6days ago Is Patient Diabetic? Yes Comments Medications reviewed with patient Daytime contact number verified with patient. Boone Master CNA/MA  November 21, 2010 3:13 PM    Physical Exam  Additional Exam:  WD, Overweight, 60 y/o WM in NAD... GENERAL:  Alert & oriented; pleasant & cooperative... HEENT:  /AT, EOM-wnl, PERRLA, EACs-clear, TMs-wnl, NOSE clear drainage   THROAT-clear & wnl. NECK:  Supple w/ full ROM; no JVD; normal carotid impulses w/o bruits; no thyromegaly or nodules palpated; no lymphadenopathy. CHEST:  Coarse BS with no few exp wheezig , barking cough  HEART:  Regular Rhythm; without murmurs/ rubs/ or gallops. ABDOMEN:  Soft & nontender; normal bowel sounds; no organomegaly or masses detected.  EXT: without deformities, mild arthritic changes; no varicose veins/ venous insuffic/ or edema.      Impression & Recommendations:  Problem #  1:  BRONCHITIS, ACUTE (ICD-466.0) Slow to resolve flare  XRAy with no acute changes.  Paln:   Mucinex DM two times a day  Tessalon three times a day  Hydromet 1-2 tsp every 4-6 hrs as needed cough--may make you sleepy Zyrtec 10mg  at bedtime for 5days Prednisone taper over next week.  Increase fluids and rest  Please contact office for sooner follow up if symptoms do not improve or worsen  Increase Glimiperide 2mg  1 tab once daily  Check sugars daily , call if sugars>250.  The following medications were removed from the medication list:    Cefdinir 300 Mg Caps (Cefdinir) .Marland Kitchen... 1 by mouth two times a day His updated medication list for this problem includes:    Hydromet  5-1.5 Mg/29ml Syrp (Hydrocodone-homatropine) .Marland Kitchen... 1-2 tsp every 4-6 hrs as needed cough    Mucinex Dm 30-600 Mg Xr12h-tab (Dextromethorphan-guaifenesin) .Marland Kitchen... 1 by mouth two times a day    Tessalon 200 Mg Caps (Benzonatate) .Marland Kitchen... 1 by mouth three times a day as needed cough  Orders: T-2 View CXR (71020TC) Nebulizer Tx (16109) Albuterol Sulfate Sol 1mg  unit dose (U0454) Est. Patient Level IV (09811)  Medications Added to Medication List This Visit: 1)  Prednisone 10 Mg Tabs (Prednisone) .... 4 tabs for 2 days, then 3 tabs for 2 days, 2 tabs for 2 days, then 1 tab for 2 days, then stop 2)  Mucinex Dm 30-600 Mg Xr12h-tab (Dextromethorphan-guaifenesin) .Marland Kitchen.. 1 by mouth two times a day 3)  Tessalon 200 Mg Caps (Benzonatate) .Marland Kitchen.. 1 by mouth three times a day as needed cough  Patient Instructions: 1)   Mucinex DM two times a day  2)  Tessalon three times a day  3)  Hydromet 1-2 tsp every 4-6 hrs as needed cough--may make you sleepy 4)  Zyrtec 10mg  at bedtime for 5days 5)  Prednisone taper over next week.  6)  Increase fluids and rest  7)  Please contact office for sooner follow up if symptoms do not improve or worsen  8)  Increase Glimiperide 2mg  1 tab once daily  9)  Check sugars daily , call if sugars>250.  10)   follow up 2 weeks and as needed  Prescriptions: HYDROMET 5-1.5 MG/5ML SYRP (HYDROCODONE-HOMATROPINE) 1-2 tsp every 4-6 hrs as needed cough  #8 oz x 0   Entered and Authorized by:   Rubye Oaks NP   Signed by:   Tammy Parrett NP on 11/21/2010   Method used:   Print then Give to Patient   RxID:   [id number] TESSALON 200 MG CAPS (BENZONATATE) 1 by mouth three times a day as needed cough  #30 x 0   Entered and Authorized by:   Rubye Oaks NP   Signed by:   Tammy Parrett NP on 11/21/2010   Method used:   [facility]ron[facility] to        [id number] ([facility]       [address]       [place], [place]  [address]       Ph: [phone] or [phone]       Fax: [phone]   RxID:   [id number] MUCINEX DM 30-600 MG XR12H-TAB (DEXTROMETHORPHAN-GUAIFENESIN) 1 by mouth two times a day  #60 x 0   Entered and Authorized by:   Rubye Oaks NP   Signed by:   Tammy Parrett NP on 11/21/2010   Method used:   Print then Give to Patient   RxID:   [id number] PREDNISONE 10 MG TABS (PREDNISONE) 4 tabs for 2 days, then 3 tabs for 2 days, 2 tabs for 2 days, then 1 tab for 2 days, then stop  #20 x 0   Entered and Authorized by:   Rubye Oaks NP   Signed by:   Tammy Parrett NP on 11/21/2010   Method used:   [facility] to        [id number]       [address]       [place], [place]  [address]       Ph: [phone] or [phone]  Fax: [phone]   RxID:   [id number]    Medication Administration  Medication # 1:    Medication: Albuterol Sulfate Sol 1mg  unit dose    Diagnosis: BRONCHITIS, ACUTE (ICD-466.0)    Dose: 1 vial    Route: inhaled    Exp Date: 11-2011    Lot #: [id number]    Mfr: nephron    Patient tolerated medication without complications    Given by: Boone Master CNA/MA (November 21, 2010 4:01 PM)  Orders Added: 1)  T-2 View CXR [71020TC] 2)  Nebulizer Tx [53664] 3)  Albuterol Sulfate Sol  1mg  unit dose [J7613] 4)  Est. Patient Level IV [40347]

## 2010-12-01 NOTE — Letter (Signed)
Summary: [facility] Results Follow Up Letter  [facility]  [address]   [place], [place] [address]   Phone: [phone]  Fax: [phone]    11/24/2010 MRN: [id number]  Joe [name] [address] [place], [place]  [address]  [place]  Dear Mr. BEAUBRUN,  We have received the results from your recent tests and have been unable to contact you.  Please call our office at [phone] so that Rubye Oaks NP or her nurse may review the results with you.    Thank you,  Nature conservation officer Pulmonary Division

## 2010-12-07 NOTE — Progress Notes (Signed)
Summary: recvd letter regarding results  Phone Note Call from Patient   Caller: Patient Call For: [facility] P Summary of Call: patient phoned stated that he recieved a letter stating that we could not get in touch with him for his results. He can be reached at [phone] Initial call taken by: Vedia Coffer,  November 29, 2010 3:34 PM  Follow-up for Phone Call        Spoke with pt and notified of results/recs per append to cxr.  Pt verbalized understanding.  He states will need to call back to schedule followup.  Not able to make appt at this time.  Follow-up by: Vernie Murders,  November 29, 2010 3:43 PM

## 2011-01-17 ENCOUNTER — Telehealth: Payer: Self-pay | Admitting: Pulmonary Disease

## 2011-01-17 MED ORDER — TESTOSTERONE 50 MG/5GM (1%) TD GEL: 5.0000 g | Freq: Every day | TRANSDERMAL | Status: DC

## 2011-01-17 NOTE — Telephone Encounter (Signed)
[facility] Printed out rx and put on SN cart for him to sign  Carver Fila, CMA

## 2011-01-17 NOTE — Telephone Encounter (Signed)
[facility] androgel.

## 2011-03-03 NOTE — Assessment & Plan Note (Signed)
[facility]                             PULMONARY OFFICE NOTE   PHILLIPS, GOULETTE                   MRN:          [id number]  DATE:12/07/2006                            DOB:          1951/05/22    HISTORY OF PRESENT ILLNESS:  Patient is a 60 year old white male patient  of Dr. Jodelle Green who has a known history of coronary artery disease,  hyperlipidemia, and diabetes mellitus, presents for a one week history  of nasal congestion, cough, and fever and chills.  Patient denies any  chest pain, orthopnea, PND, or leg swelling.   PAST MEDICAL HISTORY:  Reviewed.   CURRENT MEDICATIONS:  Reviewed.   PHYSICAL EXAMINATION:  GENERAL:  Patient is an obese white male in no  acute distress.  VITAL SIGNS:  He is afebrile with stable vital signs.  O2 saturation is  95% on room air.  HEENT:  Unremarkable.  NECK:  Supple without adenopathy.  No JVD.  LUNGS:  Lung sounds are clear.  CARDIAC:  Regular rate.  ABDOMEN:  Soft and obese.  EXTREMITIES:  Warm without any edema.   IMPRESSION/PLAN:  Acute upper respiratory infection.  Patient is given a  Z pack.  Mucinex DM twice daily.  Tussionex #4 ounces, 1 teaspoon every  12 hours as needed for cough.  Patient is to return back with Dr. Kriste [name]  as scheduled or sooner if needed.      Rubye Oaks, NP  Electronically Signed      Joe Cloud. Kriste Basque, MD  Electronically Signed   TP/MedQ  DD: 12/07/2006  DT: 12/07/2006  Job #: [id number]

## 2011-03-14 ENCOUNTER — Encounter: Payer: Self-pay | Admitting: Pulmonary Disease

## 2011-06-02 ENCOUNTER — Telehealth: Payer: Self-pay | Admitting: Pulmonary Disease

## 2011-06-02 NOTE — Telephone Encounter (Signed)
[email or web address]  

## 2011-06-05 NOTE — Telephone Encounter (Signed)
Per SN order FLP, BMET, Liver panel,  TSH, PSA, Hgb A1C. DX: V70.0. Written order on rx script and mailed to pt per his request. Carron Curie, CMA

## 2011-06-05 NOTE — Telephone Encounter (Signed)
Spoke with pt. He states has upcoming appt for cpx with SN on 06/14/11 and needs order for labs mailed to him so that he can take to [facility] with have his bloodwork done. He gets this done for free there through [facility] [employer]. Please advise thanks!

## 2011-06-05 NOTE — Telephone Encounter (Signed)
[email or web address]  

## 2011-06-14 ENCOUNTER — Ambulatory Visit (INDEPENDENT_AMBULATORY_CARE_PROVIDER_SITE_OTHER): Payer: 59 | Admitting: Pulmonary Disease

## 2011-06-14 ENCOUNTER — Encounter: Payer: Self-pay | Admitting: Pulmonary Disease

## 2011-06-14 VITALS — BP 140/86 | HR 67 | Temp 97.8°F | Ht 72.0 in | Wt 231.0 lb

## 2011-06-14 DIAGNOSIS — E119 Type 2 diabetes mellitus without complications: Secondary | ICD-10-CM

## 2011-06-14 DIAGNOSIS — E663 Overweight: Secondary | ICD-10-CM

## 2011-06-14 DIAGNOSIS — I2510 Atherosclerotic heart disease of native coronary artery without angina pectoris: Secondary | ICD-10-CM

## 2011-06-14 DIAGNOSIS — E785 Hyperlipidemia, unspecified: Secondary | ICD-10-CM

## 2011-06-14 DIAGNOSIS — Z0000 Encounter for general adult medical examination without abnormal findings: Secondary | ICD-10-CM

## 2011-06-14 DIAGNOSIS — E291 Testicular hypofunction: Secondary | ICD-10-CM

## 2011-06-14 MED ORDER — ATENOLOL 50 MG PO TABS: 50.0000 mg | ORAL_TABLET | Freq: Every day | ORAL | Status: DC

## 2011-06-14 MED ORDER — METFORMIN HCL 500 MG PO TABS: 500.0000 mg | ORAL_TABLET | Freq: Two times a day (BID) | ORAL | Status: DC

## 2011-06-14 MED ORDER — ATORVASTATIN CALCIUM 80 MG PO TABS: 40.0000 mg | ORAL_TABLET | Freq: Every day | ORAL | Status: DC

## 2011-06-14 MED ORDER — GLIMEPIRIDE 2 MG PO TABS: 1.0000 mg | ORAL_TABLET | Freq: Every day | ORAL | Status: DC

## 2011-06-14 MED ORDER — CLOPIDOGREL BISULFATE 75 MG PO TABS: 75.0000 mg | ORAL_TABLET | Freq: Every day | ORAL | Status: DC

## 2011-06-14 NOTE — Progress Notes (Signed)
Subjective:    Patient ID: Joe [name], male    DOB: [birth year], 60 y.o.   MRN: [id number]  HPI 60 y/o WM here for a follow up visit... he has multiple medical problems as noted below...   ~  June 14, 2010:  he has noted some left shoulder and arm pain w/ decr ROM; but Aleve helps & rec to use regular dosing + heat> offered Ortho referral for further eval when he is ready... he is overweight but lost 8# on diet & exerc program recently- walking, yard work, Catering manager bike, etc... he remains on ASA/ Plavix/ Aten & denies angina, palpit, SOB, etc... he hasn't seen Cards since 2005 & we will arrange for f/u check...  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          Problem List:  CAD (ICD-414.00) - presented w/ CP in 2001 w/ IWMI and cath ([place]- DrParaschos) showing a 90% RCA lesion- s/p PTCA/ stent... he also had some mild LAD dis 20-30% at that time... seen 2005 by DrCrenshaw... he is on ASA 81mg /d, PLAVIX 75mg /d, & ATENOLOL 50mg /d... ~  NuclearStressTest 7/05 showed inferolat infarct, no ischemia, impaired exerc capacity, EF=60%.  ~  8/10:  Baseline EKG showed NSR, sl IVCD, inferior scar, NAD; doing well- denies CP, palpit, SOB... walking daily w/o problems... ~  8/11: he remains asymptomatic but I have encouraged Cards f/u appt (he never did). ~  2/12:  He saw TP w/ Bronchitis> CXR was clear, wnl... ~  8/12:  EKG showed NSR, RBBB  HYPERLIPIDEMIA (ICD-272.4) - on LIPITOR 80mg - taking 1/2 tab daily... Labs all done at [facility]> ~  FLP 6/08 showed TChol 142, TG 207, HDL 32, LDL 69... told to work on diet & weight... ~  FLP 6/09 at [facility] showed TChol 140,  TG 144, HDL 32, LDL 79... improved, continue Lipitor + diet. ~  FLP 8/10 at [facility] showed TChol 145, TG 167, HDL 33, LDL 79 ~  FLP 8/11 at [facility] on Lip40 showed TChol 136, TG 125, HDL 40, LDL 71... Continue same. ~  FLP 8/12 at [facility] on Lip40 showed ==> pending  DIABETES MELLITUS (ICD-250.00) - on METFORMIN 500mg Bid, and GLIMEPIRIDE 2mg - 1/2 qd; needs better diet & get weight down. ~  labs 6/08 showed BS= 161, HgA1c= not done by [facility]... ~  labs 6/09 showed BS= 90, HgA1c= 6.0.Marland Kitchen. stay on diet, and decr the Glimepiride to 1/2 tab Qam... ~  8/10:  states BS at home are "OK"; labs at [facility] showed BS= 94, A1c not done. ~  8/11:  states BS are OK when he checks them, [facility] labs showed BS= 149, A1c= 8.0 ~  8/12:  states BS remains OK at home, labs showed ==> pending  OVERWEIGHT (ICD-278.02) - we reviewed diet+exercise needed for weight reduction. ~  weight in the 2000 decade varied betw 225-250.Marland Kitchen. ~  weight 6/09 = 231# ~  weight 8/10 = 237# ~  weight 8/11 = 229# ~  Weight 8/12 = 231#  COLONOSCOPY - He had a normal colonoscopy 3/07 by DrPatterson...  FATTY LIVER DISEASE (ICD-571.8) -  LFT's sl elevated in the past...  ~  LFT's on labs since 2008 have all been WNL...  HYPOGONADISM, MALE (ICD-257.2) - he has atrophic testes and low testosterone levels; on Rx w/ ANDROGEL 5gm/d rubbed into skin daily... ~  Labs 2007 showed Testosterone level = 177 (350-900) ~  Labs at [facility] 8/10 showed Testosterone = 171 (280-800) & rec to use the Angdrogel daily; PSA= 0.4 ~  labs at [facility] 8/11 showed Testosterone level = 125, PSA= 0.5 ~  Labs from [facility] 8/12 showed ==> pending  Health Maintenance >> he is a never smoker; quit Etoh in the 70's... ~  GI:  He had neg colon 2007, f/u due 10 yrs... ~  GU:  Atrophic testes> on Androgel 5gm/d; has ED> not currently on RX. ~  Immuniz:  s/p Tetanus shot in 6/08; s/p Pneumovax 6/09; he does not want the Flu vaccine;   Past Surgical History  Procedure  Date  . Inguinal hernia repair     right    Outpatient Encounter Prescriptions as of 06/14/2011  Medication Sig Dispense Refill  . aspirin 81 MG tablet Take 81 mg by mouth daily.        Marland Kitchen atenolol (TENORMIN) 50 MG tablet Take 50 mg by mouth daily.        Marland Kitchen atorvastatin (LIPITOR) 80 MG tablet Take 40 mg by mouth daily.        . clopidogrel (PLAVIX) 75 MG tablet Take 75 mg by mouth daily.        Marland Kitchen glimepiride (AMARYL) 2 MG tablet Take 1 mg by mouth daily before breakfast.        . metFORMIN (GLUCOPHAGE) 500 MG tablet Take 500 mg by mouth 2 (two) times daily with a meal.        . testosterone (ANDROGEL) 50 MG/5GM GEL Place 5 g onto the skin daily.  90 Package  4  . HYDROcodone-homatropine (HYCODAN) 5-1.5 MG/5ML syrup Take 5 mLs by mouth every 6 (six) hours as needed.        Marland Kitchen DISCONTD: benzonatate (TESSALON) 200 MG capsule Take 200 mg by mouth 3 (three) times daily as needed.        Marland Kitchen DISCONTD: dextromethorphan-guaiFENesin (MUCINEX DM) 30-600 MG per 12 hr tablet Take 1 tablet by mouth every 12 (twelve) hours.        Marland Kitchen DISCONTD: metFORMIN (GLUMETZA) 500 MG (MOD) 24 hr tablet Take 500 mg by mouth 2 (two) times daily with a meal.          No Known Allergies   Current Medications, Allergies, Past Medical History, Past Surgical History, Family History, and Social History were reviewed in [employer] record.    Review of Systems        The patient complains of joint pain and stiffness.  The patient denies fever, chills, sweats, anorexia, fatigue, weakness, malaise, weight loss, sleep disorder, blurring, diplopia, eye irritation, eye discharge, vision loss, eye pain, photophobia, earache, ear discharge, tinnitus, decreased hearing, nasal congestion, nosebleeds, sore throat, hoarseness, chest pain, palpitations, syncope, dyspnea on exertion, orthopnea, PND, peripheral edema, cough, dyspnea at rest, excessive sputum, hemoptysis, wheezing, pleurisy, nausea, vomiting, diarrhea,  constipation, change in bowel habits, abdominal pain, melena, hematochezia, jaundice, gas/bloating, indigestion/heartburn, dysphagia, odynophagia, dysuria, hematuria, urinary frequency, urinary hesitancy, nocturia, incontinence, back pain, joint swelling, muscle cramps, muscle weakness, arthritis, sciatica, restless legs, leg pain at night, leg pain with exertion, rash, itching, dryness, suspicious lesions, paralysis, paresthesias, seizures, tremors, vertigo, transient blindness, frequent falls,  frequent headaches, difficulty walking, depression, anxiety, memory loss, confusion, cold intolerance, heat intolerance, polydipsia, polyphagia, polyuria, unusual weight change, abnormal bruising, bleeding, enlarged lymph nodes, urticaria, allergic rash, hay fever, and recurrent infections.     Objective:   Physical Exam      WD, Overweight, 60 y/o WM in NAD... GENERAL:  Alert & oriented; pleasant & cooperative... HEENT:  [place]/AT, EOM-wnl, PERRLA, EACs-clear, TMs-wnl, NOSE-clear, THROAT-clear & wnl. NECK:  Supple w/ full ROM; no JVD; normal carotid impulses w/o bruits; no thyromegaly or nodules palpated; no lymphadenopathy. CHEST:  Clear to P & A; without wheezes/ rales/ or rhonchi. HEART:  Regular Rhythm; without murmurs/ rubs/ or gallops. ABDOMEN:  Soft & nontender; normal bowel sounds; no organomegaly or masses detected. RECTAL:  Neg - prostate 2+ rounded & nontender w/o nodules; stool hematest neg; atophic testes bilat... EXT: without deformities, mild arthritic changes; no varicose veins/ venous insuffic/ or edema. decr ROM left shoulder noted... NEURO:  CN's intact; motor testing normal; sensory testing normal; gait normal & balance OK. DERM:  mild intertrig rash noted...   Assessment & Plan:   CAD>  He was laid off work; states busy at home- house, yard, etc & denies angina etc; REC to continue ASA/ Plavix, BBlocker, Statin, DM control & all secondary risk factor reduction  strategies...  HYPERLIPID>  On Lip40 & FLPs have looked good; needs better diet & get weight down...  DM>  Diet has not been optimal & he hasn't lost any weight; we reviewed DIET, EXERCISE, & get wt down! Awaiting current labs from [facility] & if A1c is not at goal then we will be increasing his DM meds...  OBESITY>  Diet & exercise are the keys...  GU>  Hypogonadism, ED>  On Androgel & may need incr dose or switch to AXIRON if he still has Low-T on the 5gm/d...  Other medical problems as noted.Marland KitchenMarland Kitchen

## 2011-06-14 NOTE — Patient Instructions (Signed)
Today we updated your med list in EPIC...    Continue your current meds the same...  We are awaiting the fasting blood work from [facility]...    Please call the PHONE TREE in a few days for your results...    Dial [id number] & when prompted enter your patient number followed by the # symbol...    Your patient number is:   [id number]#  Let's get on track w/ our diet + exercise> the goal is to lose 15-20 lbs!!!  You should get the seasonal flu vaccine this fall when available...  Call for any problems.Marland KitchenMarland Kitchen

## 2011-07-31 ENCOUNTER — Other Ambulatory Visit: Payer: Self-pay | Admitting: *Deleted

## 2011-07-31 MED ORDER — METFORMIN HCL 500 MG PO TABS: 1000.0000 mg | ORAL_TABLET | Freq: Two times a day (BID) | ORAL | Status: DC

## 2011-07-31 MED ORDER — GLIMEPIRIDE 2 MG PO TABS: 2.0000 mg | ORAL_TABLET | Freq: Every day | ORAL | Status: DC

## 2011-07-31 NOTE — Telephone Encounter (Signed)
Called and spoke with pt about lab results per SN.  BS is at 200 and a1c at 9.0---recs to increase metformin to 2 tabs po bid and glimepiride 2 mg every morning.  Pt is aware of increase in meds and [facility]  Pt is aware.  Chol. Is ok so keep the same.   Needs to work on diet and exercise and weight loss program.  rov in 4 months with labs and pt voiced his understanding of this.

## 2011-08-09 ENCOUNTER — Encounter: Payer: Self-pay | Admitting: Pulmonary Disease

## 2011-08-09 ENCOUNTER — Telehealth: Payer: Self-pay | Admitting: Pulmonary Disease

## 2011-08-09 ENCOUNTER — Other Ambulatory Visit: Payer: Self-pay | Admitting: *Deleted

## 2011-08-09 ENCOUNTER — Telehealth: Payer: Self-pay | Admitting: *Deleted

## 2011-08-09 MED ORDER — GLUCOSE BLOOD VI STRP: ORAL_STRIP | Status: AC

## 2011-08-09 MED ORDER — GLUCOSE BLOOD VI STRP: ORAL_STRIP | Status: DC

## 2011-08-09 NOTE — Telephone Encounter (Signed)
Per SN---pt is not on insulin so SN can only approve for testing once daily---test at different times each day--keep at log and bring this in to his next ov with SN to review.  thanks

## 2011-08-09 NOTE — Telephone Encounter (Signed)
SN has signed this rx and this has been faxed per pts request.

## 2011-08-09 NOTE — Telephone Encounter (Signed)
Called, spoke with pt.  I explained to him SN can only approve for testing once daily for the strips because he is not on insulin but to test at different times each day.  I advised pt to keep a log of this with the date, times he tested, and results and bring this to his next visit with Dr. Kriste Basque to review.  He verbalized understanding of this and is aware [facility] [place].  He will call back if anything further is needed.

## 2011-08-09 NOTE — Telephone Encounter (Signed)
Spoke with pt earlier - he was requesting blood glucose test strips to be sent to [facility] for a 30 day supply.  I tried to send this but apparently the rx was printed instead.  In the meantime, Lawson Fiscal received a faxed refill request for this from mail pharmacy which she printed rx for for SN to sign and fax back.  We need to clarify where pt wants this sent to.  I lmomtcb to clarify this with the pt.

## 2011-08-09 NOTE — Telephone Encounter (Signed)
rx printed for a 30 day supply with no refills [facility] [place].  90 day rx has already been printed by Lawson Fiscal and is awaiting SN's signature.

## 2011-08-09 NOTE — Telephone Encounter (Signed)
Pt called back.  Stated he would like rx sent to [facility] for a 30 day supply to last until 90 day supply comes in the mail from [facility] [facility] [employer].  Informed pt we would send both of these for him.

## 2011-08-09 NOTE — Telephone Encounter (Signed)
Called, spoke with pt.  States he is testing blood sugars bid.  He is out of test strips and was told by his insurance they will cover the cost of these if he can get a rx from dr.  He would like a 1 month supply and uses the [facility]	[place].  Dr. Kriste Basque, are you ok with sending rx for these? I do not see where we have filled this for pt before.

## 2011-08-26 IMAGING — CR DG CHEST 2V
2 series · 2 of 2 positions shown · non-contrast
Comparison: Chest x-ray 06/15/2009

CLINICAL DATA: Cough and shortness of breath.

CHEST - 2 VIEW

[view not recorded (1 of 2)]
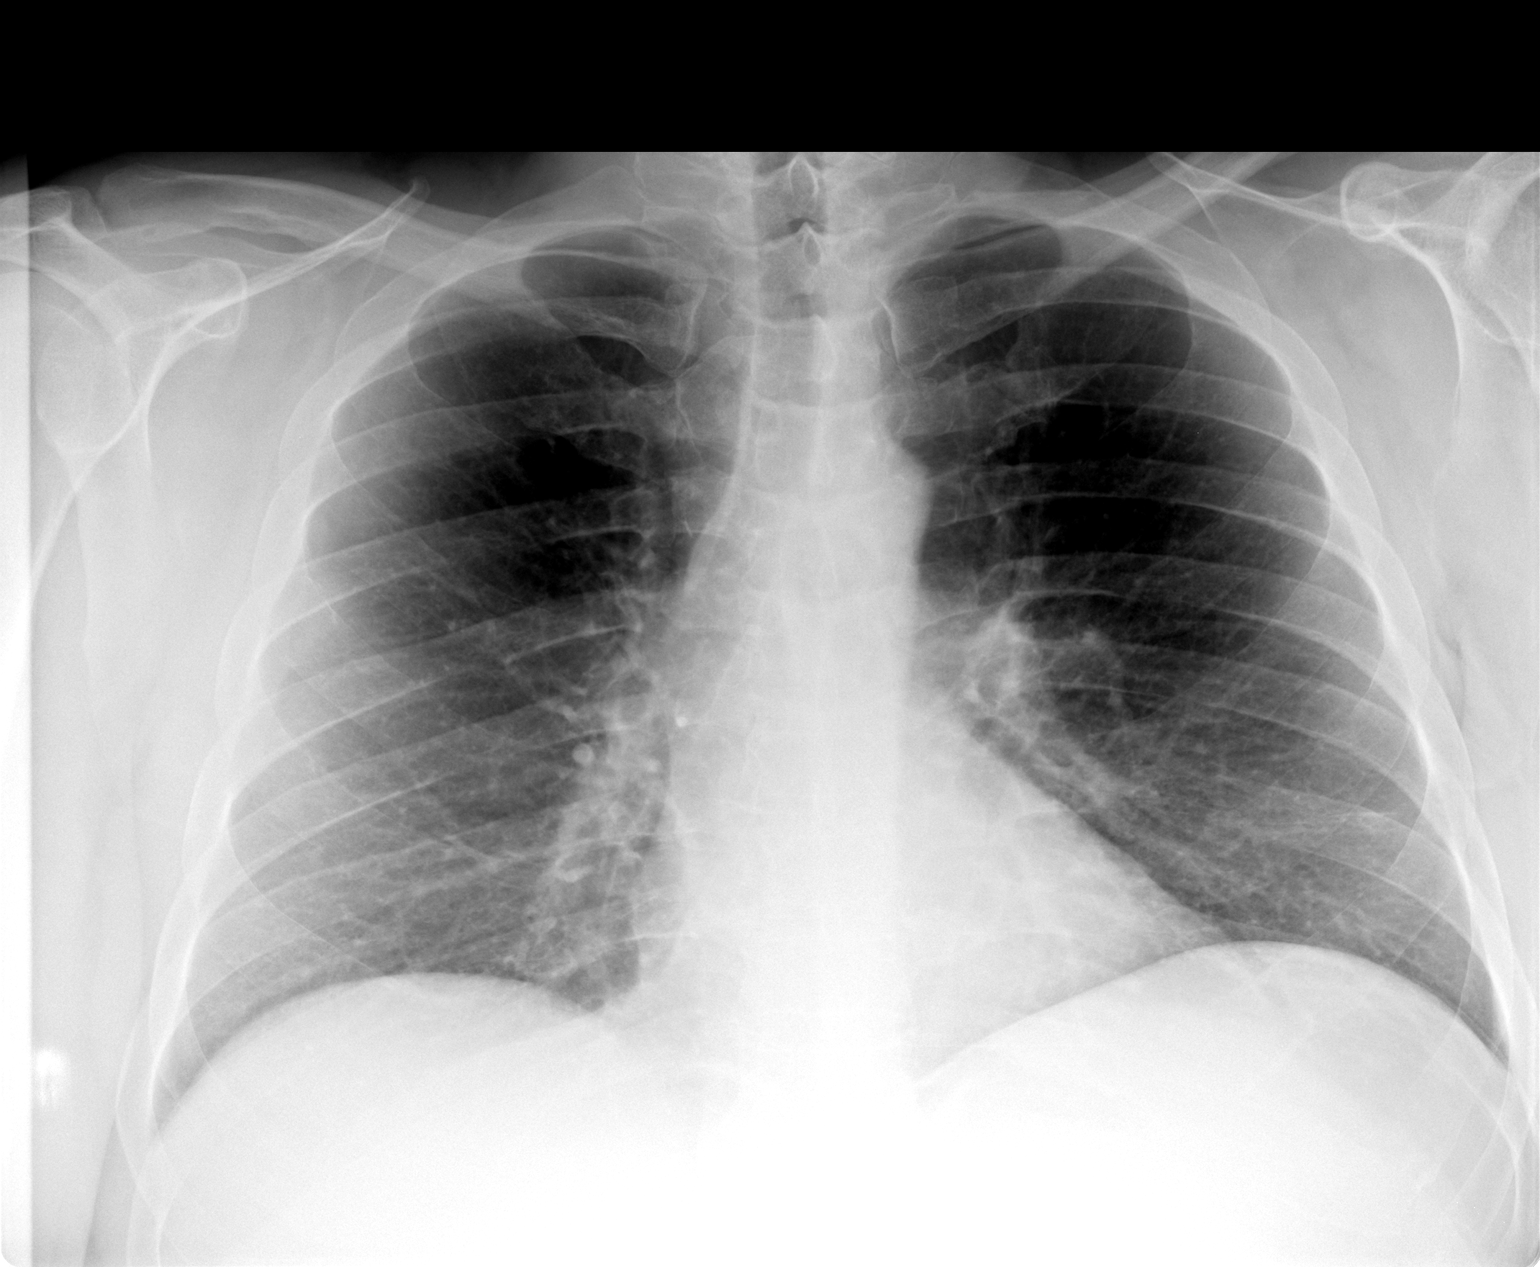

[view not recorded (2 of 2)]
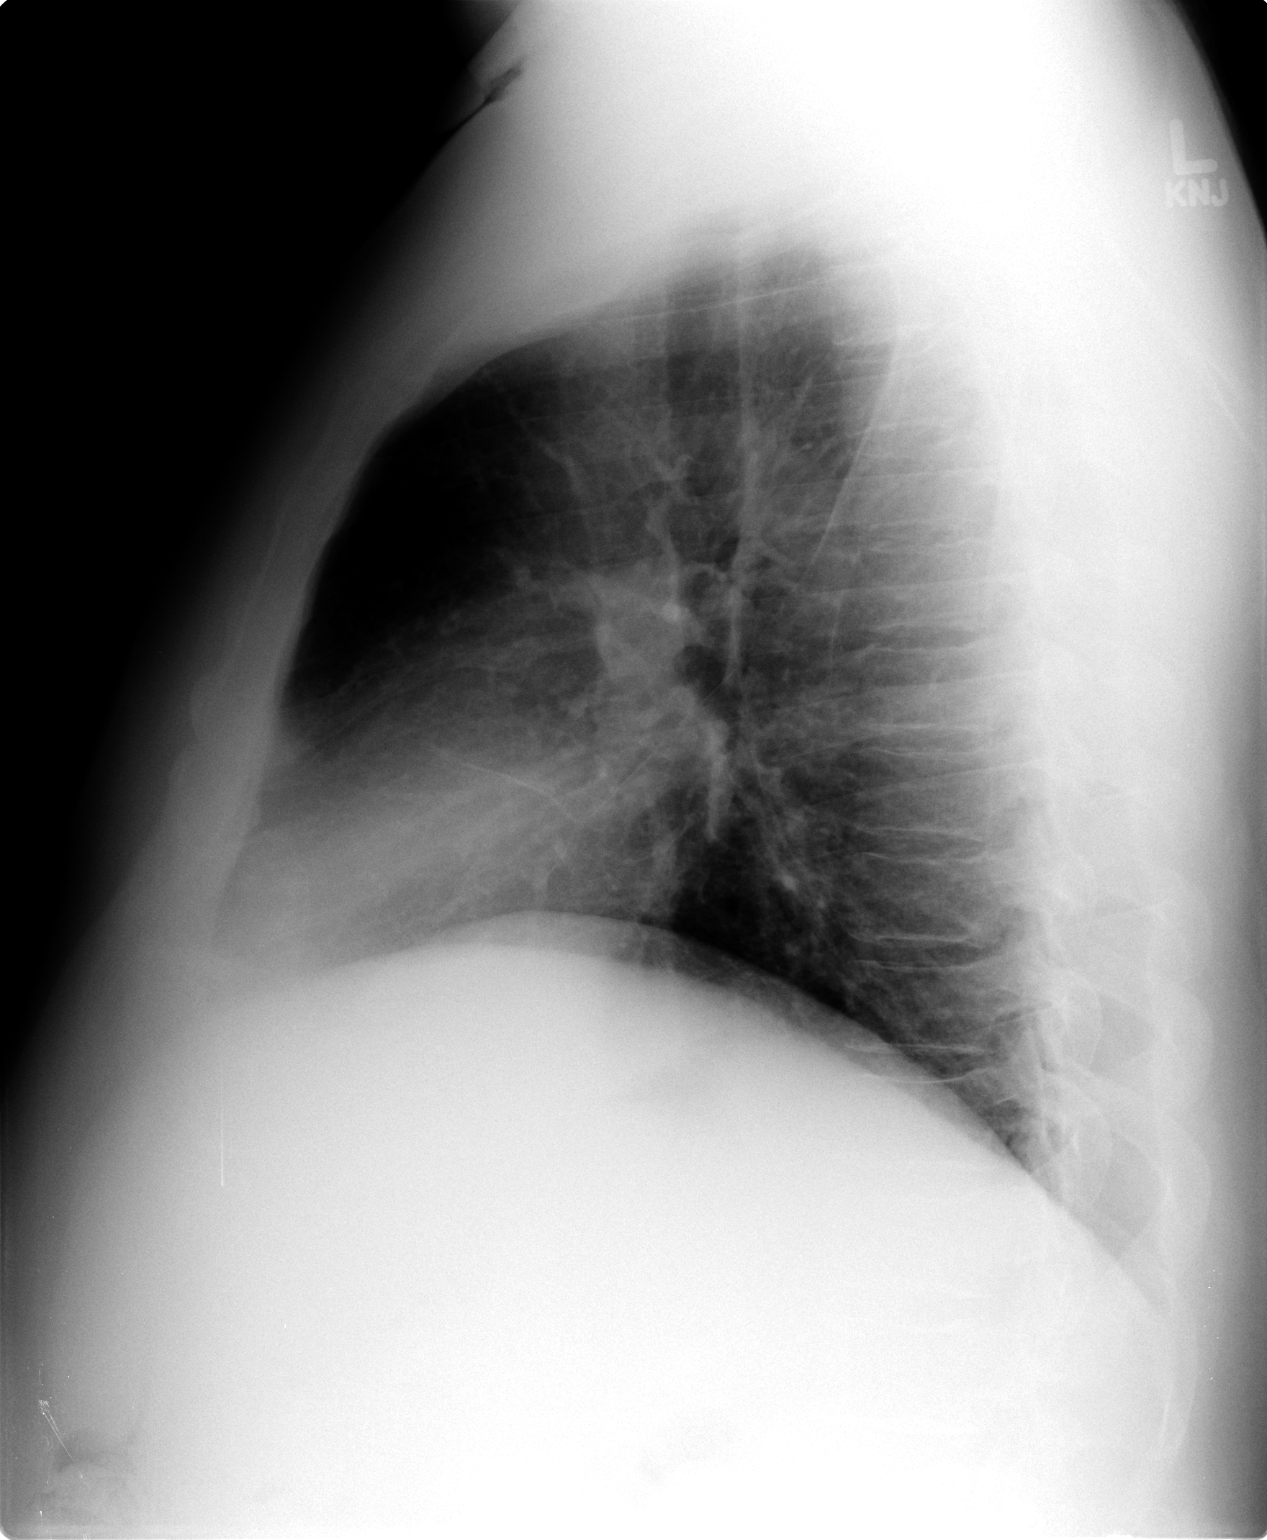

[2 of 2 positions shown; findings below may reference images not displayed]

FINDINGS: Normal and stable heart, mediastinal, and hilar contours.
The lungs are clear.  There is no pleural effusion.  No acute bony
abnormality.
IMPRESSION: No acute cardiopulmonary disease.

## 2011-09-12 ENCOUNTER — Telehealth: Payer: Self-pay | Admitting: *Deleted

## 2011-09-12 NOTE — Telephone Encounter (Signed)
Per SN---ok for rx for ua to be done and fax results back to SN.  This has been done and placed in the mail for the pt.

## 2011-09-12 NOTE — Telephone Encounter (Signed)
Called and spoke with pts wife about her rx from the pharmacy and she stated that the pt---Joe [name]  And he is in a diabetic management class and they are requesting that he have a ua done to check for ketones in his urine.   They are requesting that  rx for ua be mailed to their home.  SN please advise.  Thanks

## 2012-06-14 ENCOUNTER — Encounter: Payer: Self-pay | Admitting: Pulmonary Disease

## 2012-06-14 ENCOUNTER — Ambulatory Visit (INDEPENDENT_AMBULATORY_CARE_PROVIDER_SITE_OTHER): Payer: 59 | Admitting: Pulmonary Disease

## 2012-06-14 ENCOUNTER — Ambulatory Visit (INDEPENDENT_AMBULATORY_CARE_PROVIDER_SITE_OTHER)
Admission: RE | Admit: 2012-06-14 | Discharge: 2012-06-14 | Disposition: A | Discharge status: Home / Self Care | Payer: 59 | Source: Ambulatory Visit | Attending: Pulmonary Disease | Admitting: Pulmonary Disease

## 2012-06-14 VITALS — BP 122/66 | HR 65 | Temp 96.8°F | Ht 72.0 in | Wt 218.4 lb

## 2012-06-14 DIAGNOSIS — M549 Dorsalgia, unspecified: Secondary | ICD-10-CM

## 2012-06-14 DIAGNOSIS — Z0000 Encounter for general adult medical examination without abnormal findings: Secondary | ICD-10-CM

## 2012-06-14 DIAGNOSIS — E785 Hyperlipidemia, unspecified: Secondary | ICD-10-CM

## 2012-06-14 DIAGNOSIS — I2510 Atherosclerotic heart disease of native coronary artery without angina pectoris: Secondary | ICD-10-CM

## 2012-06-14 DIAGNOSIS — E291 Testicular hypofunction: Secondary | ICD-10-CM

## 2012-06-14 DIAGNOSIS — E119 Type 2 diabetes mellitus without complications: Secondary | ICD-10-CM

## 2012-06-14 DIAGNOSIS — E663 Overweight: Secondary | ICD-10-CM

## 2012-06-14 MED ORDER — ATORVASTATIN CALCIUM 80 MG PO TABS: 40.0000 mg | ORAL_TABLET | Freq: Every day | ORAL | Status: DC

## 2012-06-14 MED ORDER — GLIMEPIRIDE 2 MG PO TABS: ORAL_TABLET | ORAL | Status: DC

## 2012-06-14 MED ORDER — METFORMIN HCL 500 MG PO TABS: 1000.0000 mg | ORAL_TABLET | Freq: Two times a day (BID) | ORAL | Status: DC

## 2012-06-14 MED ORDER — CLOPIDOGREL BISULFATE 75 MG PO TABS: 75.0000 mg | ORAL_TABLET | Freq: Every day | ORAL | Status: DC

## 2012-06-14 MED ORDER — ATENOLOL 50 MG PO TABS: 50.0000 mg | ORAL_TABLET | Freq: Every day | ORAL | Status: DC

## 2012-06-14 NOTE — Patient Instructions (Addendum)
Today we updated your med list in our EPIC system...    Continue your current medications the same...  We reviewed your recent labs from [facility] & gave you a copy...  Today we did your follow up CXR & EKG>    We will call you w/ these results...  Keep up the good job w/ diet & exercise; your wt is down 13# - congrats!!!  Call for any problems.Marland KitchenMarland Kitchen

## 2012-06-14 NOTE — Progress Notes (Signed)
Subjective:    Patient ID: Joe [name], male    DOB: [birth year], 61 y.o.   MRN: [id number]  HPI 61 y/o WM here for a follow up visit... he has multiple medical problems as noted below...   ~  June 14, 2010:  he has noted some left shoulder and arm pain w/ decr ROM; but Aleve helps & rec to use regular dosing + heat> offered Ortho referral for further eval when he is ready... he is overweight but lost 8# on diet & exerc program recently- walking, yard work, Catering manager bike, etc... he remains on ASA/ Plavix/ Aten & denies angina, palpit, SOB, etc... he hasn't seen Cards since 2005 & we will arrange for f/u check...  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 He had labs done at [facility] prior to this visit- We reviewed prob list, meds, xrays and labs> see below for updates >> EKG 8/13 showed NSR, rate64, inferior scar, rsr' in V1... LABS 8/13:  FLP- chol ok but TG=168;  Chems- ok w/ BS=140 A1c=6.3;  TSH=1.74;  PSA= 0.3           Problem List:  CAD (ICD-414.00) - presented w/ CP in 2001 w/ IWMI and cath ([place]- DrParaschos) showing a 90% RCA lesion- s/p PTCA/ stent... he also had some mild LAD dis 20-30% at that time... seen 2005 by DrCrenshaw... he is on ASA 81mg /d, PLAVIX 75mg /d, & ATENOLOL 50mg /d... ~  NuclearStressTest 7/05 showed inferolat infarct, no ischemia, impaired  exerc capacity, EF=60%.  ~  8/10:  Baseline EKG showed NSR, sl IVCD, inferior scar, NAD; doing well- denies CP, palpit, SOB... walking daily w/o problems... ~  8/11: he remains asymptomatic but I have encouraged Cards f/u appt (he never did). ~  2/12:  He saw TP w/ Bronchitis> CXR was clear, wnl... ~  8/12:  EKG showed NSR, RBBB ~  EKG 8/13 showed NSR, rate64, inferior scar, rsr' in V1...  HYPERLIPIDEMIA (ICD-272.4) - on LIPITOR 80mg - taking 1/2 tab daily... Labs all done at [facility]> ~  FLP 6/08 showed TChol 142, TG 207, HDL 32, LDL 69... told to work on diet & weight... ~  FLP 6/09 at [facility] showed TChol 140, TG 144, HDL 32, LDL 79... improved, continue Lipitor + diet. ~  FLP 8/10 at [facility] showed TChol 145, TG 167, HDL 33, LDL 79 ~  FLP 8/11 at [facility] on Lip40 showed TChol 136, TG 125, HDL 40, LDL 71... Continue same. ~  FLP 8/12 at [facility] on Lip40 showed TChol 127, TG 170, HDL 32, LDL 61 ~  FLP 8/13 at [facility] on Lip40 showed TChol 143, TG 168, HDL 35, LDL 74  DIABETES MELLITUS (ICD-250.00) - on METFORMIN 500mg -2Bid, and GLIMEPIRIDE 4mg - 1/2 qd; needs better diet &  get weight down. ~  labs 6/08 showed BS= 161, HgA1c= not done by [facility]... On Metform500Bid. ~  labs 6/09 showed BS= 90, HgA1c= 6.0.Marland Kitchen. stay on diet, and decr the Glimepiride to 1/2 tab Qam... ~  8/10:  states BS at home are "OK"; labs at [facility] showed BS= 94, A1c not done. ~  8/11:  states BS are OK when he checks them, [facility] labs showed BS= 149, A1c= 8.0 ~  8/12:  states BS remains OK at home, but Labs showed BS=220, A1c=9.0.Marland KitchenMarland Kitchen meds increased, get on diet, etc... ~  6/13:  Eye exam by DrNice in > no diabetic retinopathy... ~  8/13:  Labs at [facility] on Metform500-2Bid+Glimep2 showed BS=140, A1c=6.3  OVERWEIGHT (ICD-278.02) - we reviewed diet+exercise needed for weight reduction. ~  weight in the 2000 decade varied betw 225-250.Marland Kitchen. ~  weight 6/09 = 231# ~  weight 8/10 = 237# ~  weight 8/11 = 229# ~  Weight  8/12 = 231# ~  Weight 8/13 = 218#... Keep up the good work.  COLONOSCOPY - He had a normal colonoscopy 3/07 by DrPatterson...  FATTY LIVER DISEASE (ICD-571.8) - LFT's sl elevated in the past...  ~  LFT's on labs since 2008 have all been WNL...  HYPOGONADISM, MALE (ICD-257.2) - he has atrophic testes and low testosterone levels; on Rx w/ ANDROGEL 5gm/d rubbed into skin daily... ~  Labs 2007 showed Testosterone level = 177 (350-900) ~  Labs at [facility] 8/10 showed Testosterone = 171 (280-800) & rec to use the Angdrogel daily; PSA= 0.4 ~  labs at [facility] 8/11 showed Testosterone level = 125, PSA= 0.5 ~  Labs from [facility] 8/12 showed PSA= 0.4 (Testos not done)... ~  Labs from [facility] 8/13 showed PSA= 0.3 and pt indicates that he's stopped Androgel on his own...  Health Maintenance >> he is a never smoker; quit Etoh in the 70's... ~  GI:  He had neg colon 2007, f/u due 10 yrs... ~  GU:  Atrophic testes> prev on Androgel but pt stopped on his own; has ED> not currently on RX. ~  Immuniz:  s/p Tetanus shot in 6/08; s/p Pneumovax 6/09; he does not want the Flu vaccine;   Past Surgical History  Procedure Date  . Inguinal hernia repair     right    Outpatient Encounter Prescriptions as of 06/14/2012  Medication Sig Dispense Refill  . aspirin 81 MG tablet Take 81 mg by mouth daily.        Marland Kitchen atenolol (TENORMIN) 50 MG tablet Take 1 tablet (50 mg total) by mouth daily.  90 tablet  3  . atorvastatin (LIPITOR) 80 MG tablet Take 0.5 tablets (40 mg total) by mouth daily.  90 tablet  3  . clopidogrel (PLAVIX) 75 MG tablet Take 1 tablet (75 mg total) by mouth daily.  90 tablet  3  . glimepiride (AMARYL) 2 MG tablet Take 1 tablet (2 mg total) by mouth daily before breakfast.  90 tablet  3  . glucose blood test strip Use once daily as instructed PT USES THE [employer] CONTOUR MACHINE  30 each  0  . HYDROcodone-homatropine (HYCODAN) 5-1.5 MG/5ML syrup Take 5 mLs by mouth every 6 (six) hours as needed.         . metFORMIN (GLUCOPHAGE) 500 MG tablet Take 2 tablets (1,000 mg total) by mouth 2 (two) times daily with a meal.  360 tablet  3  . testosterone (ANDROGEL) 50 MG/5GM GEL Place 5 g onto the skin daily.  90 Package  4    No Known Allergies   Current Medications, Allergies, Past Medical History, Past Surgical History, Family History, and Social History were reviewed in [employer] record.    Review of Systems        The patient complains of joint pain and stiffness.  The patient denies fever, chills, sweats, anorexia, fatigue, weakness, malaise, weight loss, sleep disorder, blurring, diplopia, eye irritation, eye discharge, vision loss, eye pain, photophobia, earache, ear discharge, tinnitus, decreased hearing, nasal congestion, nosebleeds, sore throat, hoarseness, chest pain, palpitations, syncope, dyspnea on exertion, orthopnea, PND, peripheral edema, cough, dyspnea at rest, excessive sputum, hemoptysis, wheezing, pleurisy, nausea, vomiting, diarrhea, constipation, change in bowel habits, abdominal pain, melena, hematochezia, jaundice, gas/bloating, indigestion/heartburn, dysphagia, odynophagia, dysuria, hematuria, urinary frequency, urinary hesitancy, nocturia, incontinence, back pain, joint swelling, muscle cramps, muscle weakness, arthritis, sciatica, restless legs, leg pain at night, leg pain with exertion, rash, itching, dryness, suspicious lesions, paralysis, paresthesias, seizures, tremors, vertigo, transient blindness, frequent falls, frequent headaches, difficulty walking, depression, anxiety, memory loss, confusion, cold intolerance, heat intolerance, polydipsia, polyphagia, polyuria, unusual weight change, abnormal bruising, bleeding, enlarged lymph nodes, urticaria, allergic rash, hay fever, and recurrent infections.     Objective:   Physical Exam      WD, Overweight, 61 y/o WM in NAD... GENERAL:  Alert & oriented; pleasant & cooperative... HEENT:  [place]/AT,  EOM-wnl, PERRLA, EACs-clear, TMs-wnl, NOSE-clear, THROAT-clear & wnl. NECK:  Supple w/ full ROM; no JVD; normal carotid impulses w/o bruits; no thyromegaly or nodules palpated; no lymphadenopathy. CHEST:  Clear to P & A; without wheezes/ rales/ or rhonchi. HEART:  Regular Rhythm; without murmurs/ rubs/ or gallops. ABDOMEN:  Soft & nontender; normal bowel sounds; no organomegaly or masses detected. RECTAL:  Neg - prostate 2+ rounded & nontender w/o nodules; stool hematest neg; atophic testes bilat... EXT: without deformities, mild arthritic changes; no varicose veins/ venous insuffic/ or edema. decr ROM left shoulder noted... NEURO:  CN's intact; motor testing normal; sensory testing normal; gait normal & balance OK. DERM:  mild intertrig rash noted...  RADIOLOGY DATA:  Reviewed in the EPIC EMR & discussed w/ the patient...  LABORATORY DATA:  Reviewed in the EPIC EMR & discussed w/ the patient...   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  HYPERLIPID>  On Lip40 & FLPs have looked good; needs better diet (low fat) & get weight down...  DM>  Diet has not been optimal but he has lost some weight this yr; we reviewed DIET, EXERCISE, & get wt down!   OBESITY>  Diet & exercise are the keys...  GU>  Hypogonadism, ED>  Prev on Androgel (pt stopped on his own) & states that his energy is good, feels well etc...  Other medical problems as noted...   Patient's Medications  New Prescriptions   No medications on file  Previous Medications   ASPIRIN 81 MG TABLET    Take 81 mg by mouth daily.     GLUCOSE BLOOD TEST STRIP    Use once daily as instructed PT USES THE [employer] CONTOUR MACHINE   HYDROCODONE-HOMATROPINE (HYCODAN) 5-1.5 MG/5ML SYRUP    Take 5 mLs by mouth every 6 (six)  hours as needed.     TESTOSTERONE (ANDROGEL) 50 MG/5GM GEL  Place 5 g onto the skin daily.  Modified Medications   Modified Medication Previous Medication   ATENOLOL (TENORMIN) 50 MG TABLET atenolol (TENORMIN) 50 MG tablet      Take 1 tablet (50 mg total) by mouth daily.    Take 1 tablet (50 mg total) by mouth daily.   ATORVASTATIN (LIPITOR) 80 MG TABLET atorvastatin (LIPITOR) 80 MG tablet      Take 0.5 tablets (40 mg total) by mouth daily.    Take 0.5 tablets (40 mg total) by mouth daily.   CLOPIDOGREL (PLAVIX) 75 MG TABLET clopidogrel (PLAVIX) 75 MG tablet      Take 1 tablet (75 mg total) by mouth daily.    Take 1 tablet (75 mg total) by mouth daily.   GLIMEPIRIDE (AMARYL) 2 MG TABLET glimepiride (AMARYL) 2 MG tablet      Take 1/2 tablet by mouth daily    Take 1 tablet (2 mg total) by mouth daily before breakfast.   METFORMIN (GLUCOPHAGE) 500 MG TABLET metFORMIN (GLUCOPHAGE) 500 MG tablet      Take 2 tablets (1,000 mg total) by mouth 2 (two) times daily with a meal.    Take 2 tablets (1,000 mg total) by mouth 2 (two) times daily with a meal.  Discontinued Medications   No medications on file

## 2012-06-15 ENCOUNTER — Encounter: Payer: Self-pay | Admitting: Pulmonary Disease

## 2012-06-28 ENCOUNTER — Encounter: Payer: Self-pay | Admitting: Pulmonary Disease

## 2013-06-18 ENCOUNTER — Encounter: Payer: Self-pay | Admitting: Pulmonary Disease

## 2013-06-18 ENCOUNTER — Ambulatory Visit (INDEPENDENT_AMBULATORY_CARE_PROVIDER_SITE_OTHER): Payer: 59 | Admitting: Pulmonary Disease

## 2013-06-18 VITALS — BP 130/70 | HR 65 | Temp 98.0°F | Ht 72.0 in | Wt 211.6 lb

## 2013-06-18 DIAGNOSIS — E663 Overweight: Secondary | ICD-10-CM

## 2013-06-18 DIAGNOSIS — E291 Testicular hypofunction: Secondary | ICD-10-CM

## 2013-06-18 DIAGNOSIS — E119 Type 2 diabetes mellitus without complications: Secondary | ICD-10-CM

## 2013-06-18 DIAGNOSIS — Z0000 Encounter for general adult medical examination without abnormal findings: Secondary | ICD-10-CM

## 2013-06-18 DIAGNOSIS — I2510 Atherosclerotic heart disease of native coronary artery without angina pectoris: Secondary | ICD-10-CM

## 2013-06-18 DIAGNOSIS — E785 Hyperlipidemia, unspecified: Secondary | ICD-10-CM

## 2013-06-18 MED ORDER — ATENOLOL 50 MG PO TABS: 50.0000 mg | ORAL_TABLET | Freq: Every day | ORAL | Status: DC

## 2013-06-18 MED ORDER — CLOPIDOGREL BISULFATE 75 MG PO TABS: 75.0000 mg | ORAL_TABLET | Freq: Every day | ORAL | Status: DC

## 2013-06-18 MED ORDER — METFORMIN HCL 500 MG PO TABS: 1000.0000 mg | ORAL_TABLET | Freq: Two times a day (BID) | ORAL | Status: DC

## 2013-06-18 MED ORDER — ATORVASTATIN CALCIUM 80 MG PO TABS: 40.0000 mg | ORAL_TABLET | Freq: Every day | ORAL | Status: DC

## 2013-06-18 MED ORDER — GLIMEPIRIDE 2 MG PO TABS: ORAL_TABLET | ORAL | Status: DC

## 2013-06-18 NOTE — Patient Instructions (Addendum)
Today we updated your med list in our EPIC system...    Continue your current medications the same...  Please get your full labs done at your convenience...    We will contact you w/ the results when available...   Keep up the great work w/ diet & exercise!!!   Call for any questions...  Let's plan a follow up visit in 39yr, sooner if needed for problems.Marland KitchenMarland Kitchen

## 2013-06-18 NOTE — Progress Notes (Signed)
Subjective:    Patient ID: Joe [name], male    DOB: 26-May-1951, 62 y.o.   MRN: [id number]  HPI 62 y/o WM here for a follow up visit... he has multiple medical problems as noted below...   ~  June 14, 2010:  he has noted some left shoulder and arm pain w/ decr ROM; but Aleve helps & rec to use regular dosing + heat> offered Ortho referral for further eval when he is ready... he is overweight but lost 8# on diet & exerc program recently- walking, yard work, Catering manager bike, etc... he remains on ASA/ Plavix/ Aten & denies angina, palpit, SOB, etc... he hasn't seen Cards since 2005 & we will arrange for f/u check...  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 He had labs done at [facility] prior to this visit- We reviewed prob list, meds, xrays and labs> see below for updates >> EKG 8/13 showed NSR, rate64, inferior scar, rsr' in V1... LABS 8/13:  FLP- chol ok but TG=168;  Chems- ok w/ BS=140 A1c=6.3;  TSH=1.74;  PSA= 0.3   ~  June 18, 2013:  Yearly ROV & CPX>  Overall Joe [name] is feeling well- still working at the [employer] at [employer], gets plenty of exercise w/o difficulty...    CAD> on ASA81, Plavix75, Aten50; BP= 130/70 & he denies CP, palpit, SOB, edema, etc; exercise= walking a lot at work & asymptomatic...    Hyperlipid> on Lip80-1/2 tab daily; FLP 7/14 at work  showed TChol 115, TG 85, HDL 38, LDL 60    DM> on Metform500Bid, Glimep2mg -1/2 daily; Labs at work 7/14 showed BS= 129, A1c= 6.4    Overweight> he has lost 7# down to 212# today; he gets exercise parking cars at [facility], but needs better diet!    Hypogonadism> hx bilat testic atrophy & Testos levels ~170; prev on Androgel 1.62% but he stopped it on his own due to change in behavior- more aggressive & irritable, didn't like the effects; states his energy is good, strength normal, feeling well...    DJD, right wrist pain> c/o right wrist pain & rec to do hot soaks, wear brace & use OTC Advil/ Aleve prn; we can refer to Ortho when needed... We reviewed prob list, meds, xrays and labs> see below for updates >>  EKG 9/14 showed NSR, rate62, infer scar, RBBB, NAD...  LABS 9/14:  Pending...           Problem List:  CAD (ICD-414.00) - presented w/ CP in 2001 w/ IWMI and cath ([place]- DrParaschos) showing a 90% RCA lesion- s/p PTCA/ stent... he also had some mild LAD dis 20-30% at that time... seen 2005 by DrCrenshaw... he is on ASA 81mg /d, PLAVIX 75mg /d, & ATENOLOL 50mg /d... ~  NuclearStressTest 7/05 showed inferolat infarct, no ischemia, impaired exerc capacity, EF=60%.  ~  8/10:  Baseline EKG showed NSR, sl IVCD, inferior scar, NAD; doing well- denies CP, palpit, SOB... walking daily w/o problems... ~  8/11: he remains asymptomatic but I have encouraged Cards f/u appt (he never did). ~  2/12:  He saw TP w/ Bronchitis> CXR was clear, wnl... ~  8/12:  EKG showed NSR, RBBB ~  EKG 8/13 showed NSR, rate64, inferior scar, rsr' in V1... ~  9/14:  on ASA81, Plavix75, Aten50; BP= 130/70 & he denies CP, palpit, SOB, edema, etc; exercise= walking a lot at work & asymptomatic; EKG showed NSR, rate62, infer scar, RBBB, NAD...   HYPERLIPIDEMIA (ICD-272.4) - on LIPITOR 80mg - taking 1/2 tab daily... Labs all done at [facility]> ~  FLP 6/08 showed TChol 142, TG 207, HDL 32, LDL 69... told to work on diet &  weight... ~  FLP 6/09 at [facility] showed TChol 140, TG 144, HDL 32, LDL 79... improved, continue Lipitor + diet. ~  FLP 8/10 at [facility] showed TChol 145, TG 167, HDL 33, LDL 79 ~  FLP 8/11 at [facility] on Lip40 showed TChol 136, TG 125, HDL 40, LDL 71... Continue same. ~  FLP 8/12 at [facility] on Lip40 showed TChol 127, TG 170, HDL 32, LDL 61 ~  FLP 8/13 at [facility] on Lip40 showed TChol 143, TG 168, HDL 35, LDL 74 ~  9/14:  on Lip80-1/2 tab daily; FLP 7/14 at work showed TChol 115, TG 85, HDL 38, LDL 60  DIABETES MELLITUS (ICD-250.00) - on METFORMIN 500mg -2Bid, and GLIMEPIRIDE 4mg - 1/2 qd; needs better diet & get weight down. ~  labs 6/08 showed BS= 161, HgA1c= not done by [facility]... On Metform500Bid. ~  labs 6/09 showed BS= 90, HgA1c= 6.0.Marland Kitchen. stay on diet, and decr the Glimepiride to 1/2 tab Qam... ~  8/10:  states BS at home are "OK"; labs at [facility] showed BS= 94, A1c not done. ~  8/11:  states BS are OK when he checks them, [facility] labs showed BS= 149, A1c= 8.0 ~  8/12:  states BS remains OK at home, but Labs showed BS=220, A1c=9.0.Marland KitchenMarland Kitchen meds increased, get on diet, etc... ~  6/13:  Eye exam by DrNice in [place]> no diabetic retinopathy... ~  8/13:  Labs at [facility] on Metform500-2Bid+Glimep2 showed BS=140, A1c=6.3 ~  9/14:  on Metform500Bid, Glimep2mg -1/2 daily; Labs at work 7/14 showed BS= 129, A1c= 6.4   OVERWEIGHT (ICD-278.02) - we reviewed diet+exercise needed for weight reduction. ~  weight in the 2000 decade varied betw 225-250.Marland Kitchen. ~  weight 6/09 = 231# ~  weight 8/10 = 237# ~  weight 8/11 = 229# ~  Weight 8/12 = 231# ~  Weight 8/13 = 218#... Keep up the good work. ~  Weight 9/14 = 212#  COLONOSCOPY - He had a normal colonoscopy 3/07 by DrPatterson...  FATTY LIVER DISEASE (ICD-571.8) - LFT's sl elevated in the past...  ~  LFT's on labs since 2008 have all been WNL...  HYPOGONADISM, MALE (ICD-257.2) - he has atrophic testes and low testosterone levels; on Rx w/ ANDROGEL 5gm/d rubbed  into skin daily... ~  Labs 2007 showed Testosterone level = 177 (350-900) ~  Labs at [facility] 8/10 showed Testosterone = 171 (280-800) & rec to use the Angdrogel daily; PSA= 0.4 ~  labs at [facility] 8/11 showed Testosterone level = 125, PSA= 0.5 ~  Labs from [facility] 8/12 showed PSA= 0.4 (Testos not done)... ~  Labs from  [facility] 8/13 showed PSA= 0.3 and pt indicates that he's stopped Androgel on his own... ~  9/14:  hx bilat testic atrophy & Testos levels ~170; prev on Androgel 1.62% but he stopped it on his own due to change in behavior- more aggressive & irritable, didn't like the effects; states his energy is good, strength normal, feeling well.  Health Maintenance >> he is a never smoker; quit Etoh in the 70's... ~  GI:  He had neg colon 2007, f/u due 10 yrs... ~  GU:  Atrophic testes> prev on Androgel but pt stopped on his own; has ED> not currently on RX. ~  Immuniz:  s/p Tetanus shot in 6/08; s/p Pneumovax 6/09; he does not want the Flu vaccine;   Past Surgical History  Procedure Laterality Date  . Inguinal hernia repair      right    Outpatient Encounter Prescriptions as of 06/18/2013  Medication Sig Dispense Refill  . aspirin 81 MG tablet Take 81 mg by mouth daily.        Marland Kitchen atenolol (TENORMIN) 50 MG tablet Take 1 tablet (50 mg total) by mouth daily.  90 tablet  3  . atorvastatin (LIPITOR) 80 MG tablet Take 0.5 tablets (40 mg total) by mouth daily.  90 tablet  3  . clopidogrel (PLAVIX) 75 MG tablet Take 1 tablet (75 mg total) by mouth daily.  90 tablet  3  . glimepiride (AMARYL) 2 MG tablet Take 1/2 tablet by mouth daily  45 tablet  3  . metFORMIN (GLUCOPHAGE) 500 MG tablet Take 2 tablets (1,000 mg total) by mouth 2 (two) times daily with a meal.  360 tablet  3  . [DISCONTINUED] HYDROcodone-homatropine (HYCODAN) 5-1.5 MG/5ML syrup Take 5 mLs by mouth every 6 (six) hours as needed.        . [DISCONTINUED] testosterone (ANDROGEL) 50 MG/5GM GEL Place 5 g onto the skin daily.  90 Package   4   No facility-administered encounter medications on file as of 06/18/2013.    No Known Allergies   Current Medications, Allergies, Past Medical History, Past Surgical History, Family History, and Social History were reviewed in [employer] record.    Review of Systems        The patient complains of joint pain and stiffness.  The patient denies fever, chills, sweats, anorexia, fatigue, weakness, malaise, weight loss, sleep disorder, blurring, diplopia, eye irritation, eye discharge, vision loss, eye pain, photophobia, earache, ear discharge, tinnitus, decreased hearing, nasal congestion, nosebleeds, sore throat, hoarseness, chest pain, palpitations, syncope, dyspnea on exertion, orthopnea, PND, peripheral edema, cough, dyspnea at rest, excessive sputum, hemoptysis, wheezing, pleurisy, nausea, vomiting, diarrhea, constipation, change in bowel habits, abdominal pain, melena, hematochezia, jaundice, gas/bloating, indigestion/heartburn, dysphagia, odynophagia, dysuria, hematuria, urinary frequency, urinary hesitancy, nocturia, incontinence, back pain, joint swelling, muscle cramps, muscle weakness, arthritis, sciatica, restless legs, leg pain at night, leg pain with exertion, rash, itching, dryness, suspicious lesions, paralysis, paresthesias, seizures, tremors, vertigo, transient blindness, frequent falls, frequent headaches, difficulty walking, depression, anxiety, memory loss, confusion, cold intolerance, heat intolerance, polydipsia, polyphagia, polyuria, unusual weight change, abnormal bruising, bleeding, enlarged lymph nodes, urticaria, allergic rash, hay fever, and recurrent infections.     Objective:   Physical Exam      WD, Overweight, 62 y/o WM in NAD... GENERAL:  Alert & oriented; pleasant & cooperative... HEENT:  [place]/AT, EOM-wnl, PERRLA, EACs-clear, TMs-wnl, NOSE-clear, THROAT-clear & wnl. NECK:  Supple w/ full ROM; no JVD; normal carotid impulses w/o bruits; no  thyromegaly or nodules palpated; no lymphadenopathy. CHEST:  Clear to P & A; without wheezes/ rales/ or rhonchi. HEART:  Regular Rhythm; without murmurs/ rubs/ or gallops. ABDOMEN:  Soft & nontender; normal bowel sounds; no organomegaly or masses detected. RECTAL:  Neg - prostate 2+ rounded & nontender w/o nodules; stool hematest neg; atophic testes bilat... EXT: without deformities, mild arthritic changes; no varicose veins/ venous insuffic/ or edema. decr ROM left shoulder noted... NEURO:  CN's intact; motor testing normal; sensory testing normal; gait normal & balance OK. DERM:  mild intertrig rash noted...  RADIOLOGY DATA:  Reviewed in the EPIC EMR & discussed w/ the patient...  LABORATORY DATA:  Reviewed in the EPIC EMR & discussed w/ the patient...   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  HYPERLIPID>  On Lip40 & FLPs have looked good; needs better diet (low fat) & get weight down...  DM>  Stable on Metform+Glimep; Diet has not been optimal but he has lost some weight this yr; we reviewed DIET, EXERCISE, & get wt down!   OBESITY>  Diet & exercise are the keys...  GU>  Hypogonadism, ED>  Prev on Androgel (pt stopped on his own due to irritability & aggressive) & states that his energy is good, feels well etc...  Other medical problems as noted...   Patient's Medications  New Prescriptions   No medications on file  Previous Medications   ASPIRIN 81 MG TABLET    Take 81 mg by mouth daily.    Modified Medications   Modified Medication Previous Medication   ATENOLOL (TENORMIN) 50 MG TABLET atenolol (TENORMIN) 50 MG tablet      Take 1 tablet (50 mg total) by mouth daily.    Take 1 tablet (50 mg total) by mouth daily.   ATORVASTATIN (LIPITOR) 80 MG TABLET atorvastatin (LIPITOR) 80 MG tablet      Take 0.5 tablets (40 mg  total) by mouth daily.    Take 0.5 tablets (40 mg total) by mouth daily.   CLOPIDOGREL (PLAVIX) 75 MG TABLET clopidogrel (PLAVIX) 75 MG tablet      Take 1 tablet (75 mg total) by mouth daily.    Take 1 tablet (75 mg total) by mouth daily.   GLIMEPIRIDE (AMARYL) 2 MG TABLET glimepiride (AMARYL) 2 MG tablet      Take 1/2 tablet by mouth daily    Take 1/2 tablet by mouth daily   METFORMIN (GLUCOPHAGE) 500 MG TABLET metFORMIN (GLUCOPHAGE) 500 MG tablet      Take 2 tablets (1,000 mg total) by mouth 2 (two) times daily with a meal.    Take 2 tablets (1,000 mg total) by mouth 2 (two) times daily with a meal.  Discontinued Medications   HYDROCODONE-HOMATROPINE (HYCODAN) 5-1.5 MG/5ML SYRUP    Take 5 mLs by mouth every 6 (six) hours as needed.     TESTOSTERONE (ANDROGEL) 50 MG/5GM GEL    Place 5 g onto the skin daily.

## 2013-08-21 ENCOUNTER — Other Ambulatory Visit: Payer: Self-pay

## 2014-04-07 ENCOUNTER — Other Ambulatory Visit: Payer: Self-pay | Admitting: Pulmonary Disease

## 2014-05-20 LAB — HM DIABETES EYE EXAM

## 2014-06-18 ENCOUNTER — Ambulatory Visit (INDEPENDENT_AMBULATORY_CARE_PROVIDER_SITE_OTHER): Payer: 59 | Admitting: Pulmonary Disease

## 2014-06-18 ENCOUNTER — Encounter: Payer: Self-pay | Admitting: Pulmonary Disease

## 2014-06-18 ENCOUNTER — Telehealth: Payer: Self-pay | Admitting: Pulmonary Disease

## 2014-06-18 VITALS — BP 110/62 | HR 54 | Temp 97.5°F | Ht 72.0 in | Wt 207.4 lb

## 2014-06-18 DIAGNOSIS — E785 Hyperlipidemia, unspecified: Secondary | ICD-10-CM

## 2014-06-18 DIAGNOSIS — Z0000 Encounter for general adult medical examination without abnormal findings: Secondary | ICD-10-CM

## 2014-06-18 DIAGNOSIS — E119 Type 2 diabetes mellitus without complications: Secondary | ICD-10-CM

## 2014-06-18 DIAGNOSIS — I2510 Atherosclerotic heart disease of native coronary artery without angina pectoris: Secondary | ICD-10-CM

## 2014-06-18 MED ORDER — GLIMEPIRIDE 2 MG PO TABS: ORAL_TABLET | ORAL | Status: DC

## 2014-06-18 MED ORDER — ATENOLOL 50 MG PO TABS: ORAL_TABLET | ORAL | Status: DC

## 2014-06-18 MED ORDER — ATORVASTATIN CALCIUM 80 MG PO TABS: 40.0000 mg | ORAL_TABLET | Freq: Every day | ORAL | Status: DC

## 2014-06-18 MED ORDER — CLOPIDOGREL BISULFATE 75 MG PO TABS
75.0000 mg | ORAL_TABLET | Freq: Every day | ORAL | Status: DC
Start: 2014-06-18 — End: 2014-06-18

## 2014-06-18 MED ORDER — METFORMIN HCL 500 MG PO TABS: 1000.0000 mg | ORAL_TABLET | Freq: Two times a day (BID) | ORAL | Status: DC

## 2014-06-18 MED ORDER — CLOPIDOGREL BISULFATE 75 MG PO TABS: 75.0000 mg | ORAL_TABLET | Freq: Every day | ORAL | Status: DC

## 2014-06-18 NOTE — Progress Notes (Signed)
Subjective:    Patient ID: Joe [name], male    DOB: April 14, 1951, 63 y.o.   MRN: [id number]  HPI 63 y/o WM here for a follow up visit... he has multiple medical problems as noted below...   ~  June 14, 2010:  he has noted some left shoulder and arm pain w/ decr ROM; but Aleve helps & rec to use regular dosing + heat> offered Ortho referral for further eval when he is ready... he is overweight but lost 8# on diet & exerc program recently- walking, yard work, Information systems manager bike, etc... he remains on ASA/ Plavix/ Aten & denies angina, palpit, SOB, etc... he hasn't seen Cards since 2005 & we will arrange for f/u check...  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 He had labs done at [facility] prior to this visit- We reviewed prob list, meds, xrays and labs> see below for updates >>  EKG 8/13 showed NSR, rate64, inferior scar, rsr' in V1...  LABS 8/13:  FLP- chol ok but TG=168;  Chems- ok w/ BS=140 A1c=6.3;  TSH=1.74;  PSA= 0.3   ~  June 18, 2013:  Yearly ROV & CPX>  Overall Joe [name] is feeling well- still working at the [employer] at [employer], gets plenty of exercise w/o difficulty...    CAD> on ASA81, Plavix75, Aten50; BP= 130/70 & he denies CP, palpit, SOB, edema, etc; exercise= walking a lot at work & asymptomatic...    Hyperlipid> on Lip80-1/2 tab daily; FLP 7/14 at work  showed TChol 115, TG 85, HDL 38, LDL 60    DM> on Metform500-2Bid, Glimep2mg -1/2 daily; Labs at work 7/14 showed BS= 129, A1c= 6.4    Overweight> he has lost 7# down to 212# today; he gets exercise parking cars at [employer], but needs better diet!    Hypogonadism> hx bilat testic atrophy & Testos levels ~170; prev on Androgel 1.62% but he stopped it on his own due to change in behavior- more aggressive & irritable, didn't like the effects; states his energy is good, strength normal, feeling well...    DJD, right wrist pain> c/o right wrist pain & rec to do hot soaks, wear brace & use OTC Advil/ Aleve prn; we can refer to Ortho when needed... We reviewed prob list, meds, xrays and labs> see below for updates >>   EKG 9/14 showed NSR, rate62, infer scar, RBBB, NAD...   LABS 9/14:  Pending... (he never got these labs done)  ~  June 18, 2014:  Yearly ROV & CPX> Joe [name] reports a good yr- no new complaints or concerns... He continues to work as a Engineer, water at the [employer] gets plenty of exercise doing this... We reviewed the following medical problems during today's office  visit >>     CAD> on ASA81, Plavix75, Aten50; BP= 110/62 & he denies CP, palpit, SOB, edema, etc; exercise= walking a lot at work & asymptomatic...    Hyperlipid> on Lip80-1/2 tab daily; FLP 8/15 at work showed TChol 115, TG 167, HDL 32, LDL 50 and we reviewed low fat diet & exercise program...    DM> on Metform500-2Bid, Glimep2mg -1/2 daily; Labs at work 8/15 showed BS= 81, A1c= 6.1 & I told him if he lost more weight we could decr his DM meds...    Overweight> he has lost 5# more down to 207# today; he gets exercise parking cars at [employer], but needs better diet!    Hypogonadism> hx bilat testic atrophy & Testos levels ~170; prev on Androgel 1.62% but he stopped it on his own due to change in behavior- more aggressive & irritable, didn't like the effects; states his energy is good, strength  normal, feeling well...    DJD, right wrist pain> he had hx right wrist pain & rec to do hot soaks, wear brace & use OTC Advil/ Aleve prn=> resolved & gets about well...  We reviewed prob list, meds, xrays and labs> see below for updates >> he will get 2015 Flu vaccine at [facility]...  LABS 8/15 at work:  FLP- Chol ok but TG=167, HDL=32;  Chems- ok w/ BS=81, A1c=6.1.Marland KitchenMarland Kitchen  He will return to [facility] & PSA => pending           Problem List:  CAD (ICD-414.00) - presented w/ CP in 2001 w/ IWMI and cath ([place]- DrParaschos) showing a 90% RCA lesion- s/p PTCA/ stent... he also had some mild LAD dis 20-30% at that time... seen 2005 by DrCrenshaw... he is on ASA 81mg /d, PLAVIX 75mg /d, & ATENOLOL 50mg /d... ~  NuclearStressTest 7/05 showed inferolat infarct, no ischemia, impaired exerc capacity, EF=60%.  ~  8/10:  Baseline EKG showed NSR, sl IVCD, inferior scar, NAD; doing well- denies CP, palpit, SOB... walking daily w/o problems... ~  8/11: he remains asymptomatic but I have encouraged Cards f/u appt (he never did). ~  2/12:  He saw TP w/ Bronchitis> CXR was clear, wnl... ~  8/12:  EKG showed NSR, RBBB ~  EKG 8/13 showed NSR, rate64, inferior scar, rsr' in V1... ~  9/14: on ASA81, Plavix75, Aten50; BP= 130/70 & he denies CP, palpit, SOB, edema, etc; exercise= walking a lot at work & asymptomatic; EKG showed NSR, rate62, infer scar, RBBB, NAD...  ~  9/15: on ASA81, Plavix75, Aten50; BP= 110/62 & he denies CP, palpit, SOB, edema, etc; exercise= walking a lot at work & asymptomatic.  HYPERLIPIDEMIA (ICD-272.4) - on LIPITOR 80mg - taking 1/2 tab daily... Labs all done at [facility]> ~  FLP 6/08 showed TChol 142, TG 207, HDL 32, LDL 69... told to work on diet & weight... ~  [place] 6/09 at [facility] showed TChol 140, TG 144, HDL 32, LDL 79... improved, continue Lipitor + diet. ~  FLP 8/10 at [facility] showed TChol 145, TG 167, HDL 33, LDL 79 ~  FLP 8/11 at [facility] on Lip40 showed TChol 136, TG 125, HDL 40, LDL  71... Continue same. ~  FLP 8/12 at [facility] on Lip40 showed TChol 127, TG 170, HDL 32, LDL 61 ~  FLP 8/13 at [facility] on Lip40 showed TChol 143, TG 168, HDL 35, LDL 74 ~  9/14: on Lip80-1/2 tab daily; FLP 7/14 at work showed TChol 115, TG 85, HDL 38, LDL 60 ~  9/15: on Lip80-1/2  tab daily; FLP 8/15 at work showed TChol 115, TG 167, HDL 32, LDL 50 and we reviewed low fat diet & exercise program...  DIABETES MELLITUS (ICD-250.00) - on METFORMIN 500mg -2Bid, and GLIMEPIRIDE 4mg - 1/2 qd; needs better diet & get weight down. ~  labs 6/08 showed BS= 161, HgA1c= not done by [facility]... On Metform500Bid. ~  labs 6/09 showed BS= 90, HgA1c= 6.0.Marland Kitchen. stay on diet, and decr the Glimepiride to 1/2 tab Qam... ~  8/10:  states BS at home are "OK"; labs at [facility] showed BS= 94, A1c not done. ~  8/11:  states BS are OK when he checks them, [facility] labs showed BS= 149, A1c= 8.0 ~  8/12:  states BS remains OK at home, but Labs showed BS=220, A1c=9.0.Marland KitchenMarland Kitchen meds increased, get on diet, etc... ~  6/13:  Eye exam by DrNice in [place]> no diabetic retinopathy... ~  8/13:  Labs at [facility] on Metform500-2Bid+Glimep2 showed BS=140, A1c=6.3 ~  9/14:  on Metform500Bid, Glimep2mg -1/2 daily; Labs at work 7/14 showed BS= 129, A1c= 6.4   OVERWEIGHT (ICD-278.02) - we reviewed diet+exercise needed for weight reduction. ~  weight in the 2000 decade varied betw 225-250.Marland Kitchen. ~  weight 6/09 = 231# ~  weight 8/10 = 237# ~  weight 8/11 = 229# ~  Weight 8/12 = 231# ~  Weight 8/13 = 218#... Keep up the good work. ~  Weight 9/14 = 212# ~  Weight 9/15 = 207#  COLONOSCOPY - He had a normal colonoscopy 3/07 by DrPatterson...  FATTY LIVER DISEASE (ICD-571.8) - LFT's sl elevated in the past...  ~  LFT's on labs since 2008 have all been WNL...  HYPOGONADISM, MALE (ICD-257.2) - he has atrophic testes and low testosterone levels; on Rx w/ ANDROGEL 5gm/d rubbed into skin daily... ~  Labs 2007 showed Testosterone level = 177 (350-900) ~  Labs  at [facility] 8/10 showed Testosterone = 171 (280-800) & rec to use the Angdrogel daily; PSA= 0.4 ~  labs at [facility] 8/11 showed Testosterone level = 125, PSA= 0.5 ~  Labs from [facility] 8/12 showed PSA= 0.4 (Testos not done)... ~  Labs from [facility] 8/13 showed PSA= 0.3 and pt indicates that he's stopped Androgel on his own... ~  9/14:  hx bilat testic atrophy & Testos levels ~170; prev on Androgel 1.62% but he stopped it on his own due to change in behavior- more aggressive & irritable, didn't like the effects; states his energy is good, strength normal, feeling well.  Health Maintenance >> he is a never smoker; quit Etoh in the 70's... ~  GI:  He had neg colon 2007, f/u due 10 yrs... ~  GU:  Atrophic testes> prev on Androgel but pt stopped on his own; has ED> not currently on RX. ~  Immuniz:  s/p Tetanus shot in 6/08; s/p Pneumovax 6/09; he does not want the Flu vaccine;   Past Surgical History  Procedure Laterality Date  . Inguinal hernia repair      right    Outpatient Encounter Prescriptions as of 06/18/2014  Medication Sig  . aspirin 81 MG tablet Take 81 mg by mouth daily.    Marland Kitchen atenolol (TENORMIN) 50 MG tablet Take 1 tablet by mouth  daily  . atorvastatin (LIPITOR) 80 MG tablet Take 0.5 tablets (40 mg total) by mouth daily.  . clopidogrel (PLAVIX) 75 MG tablet Take 1 tablet (75 mg total) by mouth daily.  Marland Kitchen glimepiride (AMARYL) 2 MG tablet Take 1/2 tablet by mouth daily  . metFORMIN (  GLUCOPHAGE) 500 MG tablet Take 2 tablets (1,000 mg total) by mouth 2 (two) times daily with a meal.    No Known Allergies   Current Medications, Allergies, Past Medical History, Past Surgical History, Family History, and Social History were reviewed in [employer] record.    Review of Systems        The patient complains of joint pain and stiffness.  The patient denies fever, chills, sweats, anorexia, fatigue, weakness, malaise, weight loss, sleep disorder, blurring, diplopia, eye  irritation, eye discharge, vision loss, eye pain, photophobia, earache, ear discharge, tinnitus, decreased hearing, nasal congestion, nosebleeds, sore throat, hoarseness, chest pain, palpitations, syncope, dyspnea on exertion, orthopnea, PND, peripheral edema, cough, dyspnea at rest, excessive sputum, hemoptysis, wheezing, pleurisy, nausea, vomiting, diarrhea, constipation, change in bowel habits, abdominal pain, melena, hematochezia, jaundice, gas/bloating, indigestion/heartburn, dysphagia, odynophagia, dysuria, hematuria, urinary frequency, urinary hesitancy, nocturia, incontinence, back pain, joint swelling, muscle cramps, muscle weakness, arthritis, sciatica, restless legs, leg pain at night, leg pain with exertion, rash, itching, dryness, suspicious lesions, paralysis, paresthesias, seizures, tremors, vertigo, transient blindness, frequent falls, frequent headaches, difficulty walking, depression, anxiety, memory loss, confusion, cold intolerance, heat intolerance, polydipsia, polyphagia, polyuria, unusual weight change, abnormal bruising, bleeding, enlarged lymph nodes, urticaria, allergic rash, hay fever, and recurrent infections.     Objective:   Physical Exam      WD, Overweight, 63 y/o WM in NAD... GENERAL:  Alert & oriented; pleasant & cooperative... HEENT:  [place]/AT, EOM-wnl, PERRLA, EACs-clear, TMs-wnl, NOSE-clear, THROAT-clear & wnl. NECK:  Supple w/ full ROM; no JVD; normal carotid impulses w/o bruits; no thyromegaly or nodules palpated; no lymphadenopathy. CHEST:  Clear to P & A; without wheezes/ rales/ or rhonchi. HEART:  Regular Rhythm; without murmurs/ rubs/ or gallops. ABDOMEN:  Soft & nontender; normal bowel sounds; no organomegaly or masses detected. RECTAL:  Neg - prostate 2+ rounded & nontender w/o nodules; stool hematest neg; atophic testes bilat... EXT: without deformities, mild arthritic changes; no varicose veins/ venous insuffic/ or edema. decr ROM left shoulder  noted... NEURO:  CN's intact; motor testing normal; sensory testing normal; gait normal & balance OK. DERM:  mild intertrig rash noted...  RADIOLOGY DATA:  Reviewed in the EPIC EMR & discussed w/ the patient...  LABORATORY DATA:  Reviewed in the EPIC EMR & discussed w/ the patient...   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  HYPERLIPID>  On Lip40 & FLPs have looked good; needs better diet (low fat) & get weight down...  DM>  Stable on Metform+Glimep; Diet has not been optimal but he has lost some weight this yr; we reviewed DIET, EXERCISE, & get wt down!   OBESITY>  Diet & exercise are the keys...  GU>  Hypogonadism, ED>  Prev on Androgel (pt stopped on his own due to irritability & aggressive) & states that his energy is good, feels well etc...  Other medical problems as noted...   Patient's Medications  New Prescriptions   No medications on file  Previous Medications   ASPIRIN 81 MG TABLET    Take 81 mg by mouth daily.    Modified Medications   Modified Medication Previous Medication   ATENOLOL (TENORMIN) 50 MG TABLET atenolol (TENORMIN) 50 MG tablet      Take 1 tablet by mouth  daily    Take 1 tablet by  mouth  daily   ATORVASTATIN (LIPITOR) 80 MG TABLET atorvastatin (LIPITOR) 80 MG tablet      Take 0.5 tablets (40 mg total) by mouth daily.    Take 0.5 tablets (40 mg total) by mouth daily.   CLOPIDOGREL (PLAVIX) 75 MG TABLET clopidogrel (PLAVIX) 75 MG tablet      Take 1 tablet (75 mg total) by mouth daily.    Take 1 tablet (75 mg total) by mouth daily.   GLIMEPIRIDE (AMARYL) 2 MG TABLET glimepiride (AMARYL) 2 MG tablet      Take 1/2 tablet by mouth daily    Take 1/2 tablet by mouth daily   METFORMIN (GLUCOPHAGE) 500 MG TABLET metFORMIN (GLUCOPHAGE) 500 MG tablet      Take 2 tablets (1,000 mg total) by mouth 2 (two)  times daily with a meal.    Take 2 tablets (1,000 mg total) by mouth 2 (two) times daily with a meal.  Discontinued Medications   No medications on file

## 2014-06-18 NOTE — Patient Instructions (Signed)
Today we updated your med list in our EPIC system...    Continue your current medications the same...  Please have [facility] check a follow up TSH (Thyroid) and PSA (Prostate) test...  Keep up the good work w/ diet, exercise & wt reduction...  If your home BS checks are low- then you will be able to stop the Glimepiride tab...  Call for any questions...  Let's plan a follow up visit in 9yr, sooner if needed for problems.Marland KitchenMarland Kitchen

## 2014-06-18 NOTE — Telephone Encounter (Signed)
Called and spoke with pt and he is aware that the rx [facility] been [facility] and nothing further is needed.

## 2014-07-10 ENCOUNTER — Encounter: Payer: Self-pay | Admitting: Pulmonary Disease

## 2014-11-04 ENCOUNTER — Encounter: Payer: Self-pay | Admitting: Gastroenterology

## 2015-03-12 ENCOUNTER — Encounter: Payer: Self-pay | Admitting: Pulmonary Disease

## 2015-04-12 ENCOUNTER — Other Ambulatory Visit: Payer: Self-pay

## 2015-05-18 LAB — HM DIABETES EYE EXAM

## 2015-06-13 ENCOUNTER — Other Ambulatory Visit: Payer: Self-pay | Admitting: Pulmonary Disease

## 2015-06-24 ENCOUNTER — Ambulatory Visit (INDEPENDENT_AMBULATORY_CARE_PROVIDER_SITE_OTHER)
Admission: RE | Admit: 2015-06-24 | Discharge: 2015-06-24 | Disposition: A | Discharge status: Home / Self Care | Payer: 59 | Source: Ambulatory Visit | Attending: Pulmonary Disease | Admitting: Pulmonary Disease

## 2015-06-24 ENCOUNTER — Encounter: Payer: Self-pay | Admitting: Pulmonary Disease

## 2015-06-24 ENCOUNTER — Ambulatory Visit (INDEPENDENT_AMBULATORY_CARE_PROVIDER_SITE_OTHER): Payer: 59 | Admitting: Pulmonary Disease

## 2015-06-24 VITALS — BP 104/60 | HR 55 | Temp 97.0°F | Ht 72.0 in | Wt 206.8 lb

## 2015-06-24 DIAGNOSIS — Z0000 Encounter for general adult medical examination without abnormal findings: Secondary | ICD-10-CM

## 2015-06-24 DIAGNOSIS — E785 Hyperlipidemia, unspecified: Secondary | ICD-10-CM | POA: Diagnosis not present

## 2015-06-24 DIAGNOSIS — E119 Type 2 diabetes mellitus without complications: Secondary | ICD-10-CM

## 2015-06-24 MED ORDER — GLIMEPIRIDE 2 MG PO TABS: ORAL_TABLET | ORAL | Status: DC

## 2015-06-24 MED ORDER — ATORVASTATIN CALCIUM 80 MG PO TABS: 40.0000 mg | ORAL_TABLET | Freq: Every day | ORAL | Status: DC

## 2015-06-24 MED ORDER — ATENOLOL 50 MG PO TABS: ORAL_TABLET | ORAL | Status: DC

## 2015-06-24 MED ORDER — METFORMIN HCL 500 MG PO TABS: ORAL_TABLET | ORAL | Status: DC

## 2015-06-24 MED ORDER — CLOPIDOGREL BISULFATE 75 MG PO TABS: 75.0000 mg | ORAL_TABLET | Freq: Every day | ORAL | Status: DC

## 2015-06-24 NOTE — Progress Notes (Signed)
Subjective:    Patient ID: Joe [name], male    DOB: 1951/05/30, 64 y.o.   MRN: [id number]  HPI 64 y/o WM here for a follow up visit... he has multiple medical problems as noted below...  ~  SEE PREV EPIC NOTES FOR OLDER DATA >>    EKG 8/13 showed NSR, rate64, inferior scar, rsr' in V1...  LABS 8/13:  FLP- chol ok but TG=168;  Chems- ok w/ BS=140 A1c=6.3;  TSH=1.74;  PSA= 0.3   ~  June 18, 2013:  Yearly ROV & CPX>  Overall Joe [name] is feeling well- still working at the [employer] at [employer], gets plenty of exercise w/o difficulty...    CAD> on ASA81, Plavix75, Aten50; BP= 130/70 & he denies CP, palpit, SOB, edema, etc; exercise= walking a lot at work & asymptomatic...    Hyperlipid> on Lip80-1/2 tab daily; FLP 7/14 at work showed TChol 115, TG 85, HDL 38, LDL 60    DM> on Metform500-2Bid, Glimep2mg -1/2 daily; Labs at work 7/14 showed BS= 129, A1c= 6.4    Overweight> he has lost 7# down to 212# today; he gets exercise parking cars at [facility], but needs better diet!    Hypogonadism> hx bilat testic atrophy & Testos levels ~170; prev on Androgel 1.62% but he stopped it on his own due to change in behavior- more aggressive & irritable, didn't like the effects; states his energy is good, strength normal, feeling well...    DJD, right wrist pain> c/o right wrist pain & rec to do hot soaks, wear brace & use OTC Advil/ Aleve prn; we can refer to Ortho when needed... We reviewed prob list, meds, xrays and labs> see below for updates >>   EKG 9/14 showed NSR, rate62, infer scar, RBBB, NAD...   LABS 9/14:  Pending... (he never got these labs done)  ~  June 18, 2014:  Yearly ROV & CPX> Joe [name] reports a good yr- no new complaints or concerns... He continues to work as a Engineer, water at the [employer] gets plenty of exercise doing this... We reviewed the following medical problems during today's office visit >>     CAD> on ASA81, Plavix75, Aten50; BP=  110/62 & he denies CP, palpit, SOB, edema, etc; exercise= walking a lot at work & asymptomatic...    Hyperlipid> on Lip80-1/2 tab daily; FLP 8/15 at work showed TChol 115, TG 167, HDL 32, LDL 50 and we reviewed low fat diet & exercise program...    DM> on Metform500-2Bid, Glimep2mg -1/2 daily; Labs at work 8/15 showed BS= 81, A1c= 6.1 & I told him if he lost more weight we could decr his DM meds...    Overweight> he has lost 5# more down to 207# today; he gets exercise parking cars at [facility], but needs better diet!    Hypogonadism> hx bilat testic atrophy & Testos levels ~170; prev on Androgel 1.62% but he stopped it on his own due to change in behavior- more aggressive & irritable, didn't like the effects; states his energy is good, strength normal, feeling well...    DJD, right wrist pain> he had hx right wrist pain & rec to do hot soaks, wear brace & use OTC Advil/ Aleve prn=> resolved & gets about well...  We reviewed prob list, meds, xrays and labs> see below for updates >> he will get 2015 Flu vaccine at [facility]...  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 We reviewed the following medical problems during today's office visit >>     CAD> on ASA81, Plavix75, Aten50; BP= 104/60 & he denies CP, palpit, SOB, edema, etc; exercise= walking a lot at work & asymptomatic...    Hyperlipid> on Lip80-1/2 tab daily; FLP 9/16 at work showed TChol 114, TG 176, HDL 32, LDL 46 and we reviewed low fat diet & exercise program...    DM> on Metform500-2Bid, Glimep2mg -1/2 daily; Labs at work 9/16 showed BS= 108, A1c= 6.4 & I  told him if he lost more weight we could decr his DM meds...    Overweight> wt is stable at 207# today; he gets exercise parking cars at [facility], but needs better diet!    Hypogonadism> hx bilat testic atrophy & Testos levels ~170; prev on Androgel 1.62% but he stopped it on his own due to change in behavior- more aggressive & irritable, didn't like the effects; states his energy is good, strength normal, feeling well...    DJD, right wrist pain> he had hx right wrist pain & rec to do hot soaks, wear brace & use OTC Advil/ Aleve prn=> resolved & gets about well...  We reviewed prob list, meds, xrays and labs> see below for updates >> he was advised he needs CBC, TSH, PSA but he declines additional labs  CXR 06/24/15 showed norm heart size, mild bibasilar pleuroparenchymal scarring, no change and NAD...   LABS 06/2015 from [facility] at work (he refused full labs here in our office)> FLP- TChol 114, TG 176, HDL 32, LDL 46;  BS=108, A1c=6.4;  Cr=0.85           Problem List:  CAD (ICD-414.00) - presented w/ CP in 2001 w/ IWMI and cath ([place]- DrParaschos) showing a 90% RCA lesion- s/p PTCA/ stent... he also had some mild LAD dis 20-30% at that time... seen 2005 by DrCrenshaw... he is on ASA 81mg /d, PLAVIX 75mg /d, & ATENOLOL 50mg /d... ~  NuclearStressTest 7/05 showed inferolat infarct, no ischemia, impaired exerc capacity, EF=60%.  ~  8/10:  Baseline EKG showed NSR, sl IVCD, inferior scar, NAD; doing well- denies CP, palpit, SOB... walking daily w/o problems... ~  8/11: he remains asymptomatic but I have encouraged Cards f/u appt (he never did). ~  2/12:  He saw TP w/ Bronchitis> CXR was clear, wnl... ~  8/12:  EKG showed NSR, RBBB ~  EKG 8/13 showed NSR, rate64, inferior scar, rsr' in V1... ~  9/14: on ASA81, Plavix75, Aten50; BP= 130/70 & he denies CP, palpit, SOB, edema, etc; exercise= walking a lot at work & asymptomatic; EKG showed NSR, rate62, infer scar, RBBB, NAD...  ~   9/15: on ASA81, Plavix75, Aten50; BP= 110/62 & he denies CP, palpit, SOB, edema, etc; exercise= walking a lot at work & asymptomatic. ~  9/16: on ASA81, Plavix75, Aten50; BP= 104/60 & he denies CP, palpit, SOB, edema, etc; exercise= walking a lot at work & asymptomatic.  HYPERLIPIDEMIA (ICD-272.4) - on LIPITOR 80mg - taking 1/2 tab daily... Labs all done at [facility]> ~  FLP 6/08 showed TChol 142, TG 207,  HDL 32, LDL 69... told to work on diet & weight... ~  [place] 6/09 at [facility] showed TChol 140, TG 144, HDL 32, LDL 79... improved, continue Lipitor + diet. ~  FLP 8/10 at [facility] showed TChol 145, TG 167, HDL 33, LDL 79 ~  FLP 8/11 at [facility] on Lip40 showed TChol 136, TG 125, HDL 40, LDL 71... Continue same. ~  [place] 8/12 at [facility] [address] showed TChol 127, TG 170, HDL 32, LDL 61 ~  FLP 8/13 at [facility] on Lip40 showed TChol 143, TG 168, HDL 35, LDL 74 ~  9/14: on Lip80-1/2 tab daily; FLP 7/14 at work showed TChol 115, TG 85, HDL 38, LDL 60 ~  9/15: on Lip80-1/2 tab daily; FLP 8/15 at work showed TChol 115, TG 167, HDL 32, LDL 50 and we reviewed low fat diet & exercise program... ~  9/16: on Lip80-1/2 tab daily; FLP 9/16 at work showed TChol 114, TG 176, HDL 32, LDL 46 and we reviewed low fat diet & exercise program.  DIABETES MELLITUS (ICD-250.00) - on METFORMIN 500mg -2Bid, and GLIMEPIRIDE 4mg - 1/2 qd; needs better diet & get weight down. ~  labs 6/08 showed BS= 161, HgA1c= not done by [facility]... On Metform500Bid. ~  labs 6/09 showed BS= 90, HgA1c= 6.0.Marland Kitchen. stay on diet, and decr the Glimepiride to 1/2 tab Qam... ~  8/10:  states BS at home are "OK"; labs at [facility] showed BS= 94, A1c not done. ~  8/11:  states BS are OK when he checks them, [facility] labs showed BS= 149, A1c= 8.0 ~  8/12:  states BS remains OK at home, but Labs showed BS=220, A1c=9.0.Marland KitchenMarland Kitchen meds increased, get on diet, etc... ~  6/13:  Eye exam by DrNice in [place]> no diabetic retinopathy... ~  8/13:  Labs at [facility] on  Metform500-2Bid+Glimep2 showed BS=140, A1c=6.3 ~  9/14:  on Metform500Bid, Glimep2mg -1/2 daily; Labs at work 7/14 showed BS= 129, A1c= 6.4  ~  9/15: on Metform500-2Bid, Glimep2mg -1/2 daily; Labs at work 8/15 showed BS= 81, A1c= 6.1 & I told him if he lost more weight we could decr his DM meds. ~  9/16: on Metform500-2Bid, Glimep2mg -1/2 daily; Labs at work 9/16 showed BS= 108, A1c= 6.4; continue same, get wt down.  OVERWEIGHT (ICD-278.02) - we reviewed diet+exercise needed for weight reduction. ~  weight in the 2000 decade varied betw 225-250.Marland Kitchen. ~  weight 6/09 = 231# ~  weight 8/10 = 237# ~  weight 8/11 = 229# ~  Weight 8/12 = 231# ~  Weight 8/13 = 218#... Keep up the good work. ~  Weight 9/14 = 212# ~  Weight 9/15 = 207# ~  Weight 9/16 = 207#  COLONOSCOPY - He had a normal colonoscopy 3/07 by DrPatterson...  FATTY LIVER DISEASE (ICD-571.8) - LFT's sl elevated in the past...  ~  LFT's on labs since 2008 have all been WNL...  HYPOGONADISM, MALE (ICD-257.2) - he has atrophic testes and low testosterone levels; on Rx w/ ANDROGEL 5gm/d rubbed into skin daily... ~  Labs 2007 showed Testosterone level = 177 (350-900) ~  Labs at [facility] 8/10 showed Testosterone = 171 (280-800) & rec to use the Angdrogel daily; PSA= 0.4 ~  labs at [facility] 8/11 showed Testosterone level = 125, PSA= 0.5 ~  Labs from [facility] 8/12 showed PSA= 0.4 (Testos not done)... ~  Labs from [facility] 8/13 showed PSA= 0.3 and pt indicates that he's stopped Androgel on his own... ~  9/14:  hx bilat testic atrophy &  Testos levels ~170; prev on Androgel 1.62% but he stopped it on his own due to change in behavior- more aggressive & irritable, didn't like the effects; states his energy is good, strength normal, feeling well.  Health Maintenance >> he is a never smoker; quit Etoh in the 70's... ~  GI:  He had neg colon 2007, f/u due 10 yrs... ~  GU:  Atrophic testes> prev on Androgel but pt stopped on his own; has ED> not currently  on RX. ~  Immuniz:  s/p Tetanus shot in 6/08; s/p Pneumovax 6/09; he does not want the Flu vaccine;   Past Surgical History  Procedure Laterality Date  . Inguinal hernia repair      right    Outpatient Encounter Prescriptions as of 06/24/2015  Medication Sig  . aspirin 81 MG tablet Take 81 mg by mouth daily.    Marland Kitchen atenolol (TENORMIN) 50 MG tablet Take 1 tablet by mouth  daily  . atorvastatin (LIPITOR) 80 MG tablet Take 0.5 tablets (40 mg total) by mouth daily.  . clopidogrel (PLAVIX) 75 MG tablet Take 1 tablet (75 mg total) by mouth daily.  Marland Kitchen glimepiride (AMARYL) 2 MG tablet Take 1/2 tablet by mouth daily  . metFORMIN (GLUCOPHAGE) 500 MG tablet Take 2 tablets (1,000 mg  total) by mouth 2 times  daily with a meal.   No facility-administered encounter medications on file as of 06/24/2015.    No Known Allergies    Immunization History  Administered Date(s) Administered  . Influenza Split 07/17/2011, 07/18/2012  . Influenza Whole 07/28/2008, 08/05/2009, 07/16/2010  . Pneumococcal Polysaccharide-23 04/09/2008  . Tdap 08/17/2011    Current Medications, Allergies, Past Medical History, Past Surgical History, Family History, and Social History were reviewed in [employer] record.    Review of Systems        The patient complains of joint pain and stiffness.  The patient denies fever, chills, sweats, anorexia, fatigue, weakness, malaise, weight loss, sleep disorder, blurring, diplopia, eye irritation, eye discharge, vision loss, eye pain, photophobia, earache, ear discharge, tinnitus, decreased hearing, nasal congestion, nosebleeds, sore throat, hoarseness, chest pain, palpitations, syncope, dyspnea on exertion, orthopnea, PND, peripheral edema, cough, dyspnea at rest, excessive sputum, hemoptysis, wheezing, pleurisy, nausea, vomiting, diarrhea, constipation, change in bowel habits, abdominal pain, melena, hematochezia, jaundice, gas/bloating, indigestion/heartburn,  dysphagia, odynophagia, dysuria, hematuria, urinary frequency, urinary hesitancy, nocturia, incontinence, back pain, joint swelling, muscle cramps, muscle weakness, arthritis, sciatica, restless legs, leg pain at night, leg pain with exertion, rash, itching, dryness, suspicious lesions, paralysis, paresthesias, seizures, tremors, vertigo, transient blindness, frequent falls, frequent headaches, difficulty walking, depression, anxiety, memory loss, confusion, cold intolerance, heat intolerance, polydipsia, polyphagia, polyuria, unusual weight change, abnormal bruising, bleeding, enlarged lymph nodes, urticaria, allergic rash, hay fever, and recurrent infections.     Objective:   Physical Exam      WD, Overweight, 64 y/o WM in NAD... GENERAL:  Alert & oriented; pleasant & cooperative... HEENT:  [place]/AT, EOM-wnl, PERRLA, EACs-clear, TMs-wnl, NOSE-clear, THROAT-clear & wnl. NECK:  Supple w/ full ROM; no JVD; normal carotid impulses w/o bruits; no thyromegaly or nodules palpated; no lymphadenopathy. CHEST:  Clear to P & A; without wheezes/ rales/ or rhonchi. HEART:  Regular Rhythm; without murmurs/ rubs/ or gallops. ABDOMEN:  Soft & nontender; normal bowel sounds; no organomegaly or masses detected. RECTAL:  Neg - prostate 2+ rounded & nontender w/o nodules; stool hematest neg; atophic testes bilat... EXT: without deformities, mild arthritic changes; no varicose veins/ venous insuffic/ or  edema. decr ROM left shoulder noted... NEURO:  CN's intact; motor testing normal; sensory testing normal; gait normal & balance OK. DERM:  mild intertrig rash noted...  RADIOLOGY DATA:  Reviewed in the EPIC EMR & discussed w/ the patient...  LABORATORY DATA:  Reviewed in the EPIC EMR & discussed w/ the patient...   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  HYPERLIPID>  On Lip40 & FLPs have looked good; needs better diet (low fat) & get weight down...  DM>  Stable on Metform+Glimep; Diet has not been optimal but he has lost some weight this yr; we reviewed DIET, EXERCISE, & get wt down!   OBESITY>  Diet & exercise are the keys...  GU>  Hypogonadism, ED>  Prev on Androgel (pt stopped on his own due to irritability & aggressive) & states that his energy is good, feels well etc...  Other medical problems as noted...   Patient's Medications  New Prescriptions   No medications on file  Previous Medications   ASPIRIN 81 MG TABLET    Take 81 mg by mouth daily.    Modified Medications   Modified Medication Previous Medication   ATENOLOL (TENORMIN) 50 MG TABLET atenolol (TENORMIN) 50 MG tablet      Take 1 tablet by mouth  daily    Take 1 tablet by mouth  daily   ATORVASTATIN (LIPITOR) 80 MG TABLET atorvastatin (LIPITOR) 80 MG tablet      Take 0.5 tablets (40 mg total) by mouth daily.    Take 0.5 tablets (40 mg total) by mouth daily.   CLOPIDOGREL (PLAVIX) 75 MG TABLET clopidogrel (PLAVIX) 75 MG tablet      Take 1 tablet (75 mg total) by mouth daily.    Take 1 tablet (75 mg total) by mouth daily.   GLIMEPIRIDE (AMARYL) 2 MG TABLET glimepiride (AMARYL) 2 MG tablet      Take 1/2 tablet by mouth daily    Take 1/2 tablet by mouth daily   METFORMIN (GLUCOPHAGE) 500 MG TABLET metFORMIN (GLUCOPHAGE) 500 MG tablet      Take 2 tablets (1,000 mg  total) by mouth 2 times  daily with a meal.    Take 2 tablets (1,000 mg  total) by mouth 2 times  daily with a meal.  Discontinued Medications   No medications on file

## 2015-06-24 NOTE — Patient Instructions (Signed)
Today we updated your med list in our EPIC system...    Continue your current medications the same...    We refilled your meds per request...  Today we did your follow up CXR & EKG...    We will contact you w/ the results when available...   We reviewed your labs from work>    Please have the additional TSH & PSA done at [facility] & mailed to me to review...  Keep up the good work w/ diet & exercise...  Call for any questions...  Let's plan a follow up visit in 26yr, sooner if needed for problems.Marland KitchenMarland Kitchen

## 2015-07-12 ENCOUNTER — Encounter: Payer: Self-pay | Admitting: Podiatry

## 2015-07-12 ENCOUNTER — Ambulatory Visit (INDEPENDENT_AMBULATORY_CARE_PROVIDER_SITE_OTHER): Payer: 59 | Admitting: Podiatry

## 2015-07-12 VITALS — BP 105/58 | HR 55 | Resp 16

## 2015-07-12 DIAGNOSIS — L603 Nail dystrophy: Secondary | ICD-10-CM | POA: Diagnosis not present

## 2015-07-12 DIAGNOSIS — M722 Plantar fascial fibromatosis: Secondary | ICD-10-CM | POA: Diagnosis not present

## 2015-07-12 NOTE — Progress Notes (Signed)
   Subjective:    Patient ID: Joe [name], male    DOB: 01-14-1951, 64 y.o.   MRN: [id number]  HPI he presents today with a chief complaint of painful elongated toenails that are thick yellow dystrophic and hard to cut. He states they've been like this for many years but he would like to see if there is anything that can be done about the discoloration and thickness. This becoming more more, difficult for him to trim them on his own.    Review of Systems  Skin:       Change in nails  All other systems reviewed and are negative.      Objective:   Physical Exam: I have reviewed his past medical history medications allergies surgeon social history. 64 year old white male in no apparent distress vital signs stable alert and oriented 3. Pulses are moderately palpable bilateral neurologic sensorium is intact. Deep tendon reflexes are intact bilateral muscle strength +5 over 5 dorsiflexion plantar flexors and inverters everters onto the musculature is intact. Orthopedic evaluation mild flexible hammertoe deformities bilateral. Otherwise all joints distal to the ankle have a full range of motion without crepitation. Cutaneous evaluation demonstrates supple well-hydrated cutis his nail plates are thick yellow dystrophic onychomycotic particularly the hallux bilateral and the lesser digits to the right foot primarily.     Assessment & Plan:  64 year old white male with dermatophytosis of the nail.  Plan: I sampled the nail and skin today to be sent for pathologic evaluation will notify him of the results.

## 2015-08-04 ENCOUNTER — Encounter: Payer: Self-pay | Admitting: Podiatry

## 2015-08-04 ENCOUNTER — Ambulatory Visit (INDEPENDENT_AMBULATORY_CARE_PROVIDER_SITE_OTHER): Payer: 59 | Admitting: Podiatry

## 2015-08-04 VITALS — BP 95/54 | HR 62 | Resp 16

## 2015-08-04 DIAGNOSIS — Z79899 Other long term (current) drug therapy: Secondary | ICD-10-CM

## 2015-08-04 DIAGNOSIS — L603 Nail dystrophy: Secondary | ICD-10-CM | POA: Diagnosis not present

## 2015-08-04 LAB — CBC WITH DIFFERENTIAL/PLATELET
BASOS ABS: 0 10*3/uL (ref 0.0–0.2)
Basos: 1 %
EOS (ABSOLUTE): 0.2 10*3/uL (ref 0.0–0.4)
EOS: 3 %
HEMATOCRIT: 41.4 % (ref 37.5–51.0)
Hemoglobin: 13.9 g/dL (ref 12.6–17.7)
IMMATURE GRANULOCYTES: 0 %
Immature Grans (Abs): 0 10*3/uL (ref 0.0–0.1)
Lymphocytes Absolute: 1.7 10*3/uL (ref 0.7–3.1)
Lymphs: 28 %
MCH: 28.5 pg (ref 26.6–33.0)
MCHC: 33.6 g/dL (ref 31.5–35.7)
MCV: 85 fL (ref 79–97)
MONOS ABS: 0.6 10*3/uL (ref 0.1–0.9)
Monocytes: 10 %
NEUTROS PCT: 58 %
Neutrophils Absolute: 3.6 10*3/uL (ref 1.4–7.0)
PLATELETS: 187 10*3/uL (ref 150–379)
RBC: 4.88 x10E6/uL (ref 4.14–5.80)
RDW: 13.5 % (ref 12.3–15.4)
WBC: 6 10*3/uL (ref 3.4–10.8)

## 2015-08-04 MED ORDER — TERBINAFINE HCL 250 MG PO TABS: 250.0000 mg | ORAL_TABLET | Freq: Every day | ORAL | Status: DC

## 2015-08-04 NOTE — Progress Notes (Signed)
He presents today for a follow-up of his lab report from his toenail clippings.  Objective: Vital signs are stable he is alert and oriented 3. I have reviewed his past mental history medications allergies and surgeries social history. Pathology report does demonstrate onychomycosis.  Assessment: Onychomycosis.  Plan: We discussed oral therapy versus laser therapy and this point he has chosen oral therapy. We discussed the pros and cons of this as well as the risks. We started him on Lamisil 250 mg tablets 1 by mouth daily and I also requested blood work consisting of a liver profile. I will follow-up with him in 1 month.  Roselind Messier DPM

## 2015-08-05 LAB — HEPATIC FUNCTION PANEL
ALT: 33 IU/L (ref 0–44)
AST: 29 IU/L (ref 0–40)
Albumin: 4.6 g/dL (ref 3.6–4.8)
Alkaline Phosphatase: 59 IU/L (ref 39–117)
BILIRUBIN TOTAL: 0.7 mg/dL (ref 0.0–1.2)
BILIRUBIN, DIRECT: 0.17 mg/dL (ref 0.00–0.40)
Total Protein: 6.9 g/dL (ref 6.0–8.5)

## 2015-08-06 ENCOUNTER — Telehealth: Payer: Self-pay

## 2015-08-06 NOTE — Telephone Encounter (Signed)
Left message for pt stating labs were normal and to conitnue medication and keep f/u appt

## 2015-09-01 ENCOUNTER — Ambulatory Visit (INDEPENDENT_AMBULATORY_CARE_PROVIDER_SITE_OTHER): Payer: 59 | Admitting: Podiatry

## 2015-09-01 ENCOUNTER — Encounter: Payer: Self-pay | Admitting: Podiatry

## 2015-09-01 ENCOUNTER — Telehealth: Payer: Self-pay | Admitting: *Deleted

## 2015-09-01 VITALS — BP 119/64 | HR 58 | Resp 18

## 2015-09-01 DIAGNOSIS — Z79899 Other long term (current) drug therapy: Secondary | ICD-10-CM

## 2015-09-01 DIAGNOSIS — B351 Tinea unguium: Secondary | ICD-10-CM | POA: Diagnosis not present

## 2015-09-01 MED ORDER — TERBINAFINE HCL 250 MG PO TABS: 250.0000 mg | ORAL_TABLET | Freq: Every day | ORAL | Status: DC

## 2015-09-01 NOTE — Telephone Encounter (Signed)
[facility] sent notice that Terbinafine 250mg  had been discontinued.  I informed pt [facility] would have the medication on their $4.00-$12.00 formulary and he changed the pharmacy to [place].  I changed Terbinafine 250mg  #90 one tablet daily to [facility].

## 2015-09-01 NOTE — Progress Notes (Signed)
He follows up today for treatment of his onychomycosis with Lamisil. He states that he has taken 1 months of Lamisil with no complications. He denies fever chills nausea vomiting muscle aches and pains itching or rashes. No change in the nail plates yet he says.  Objective: Vital signs are stable he is alert and oriented 3. Pulses are strongly palpable. Neurologic sensorium is intact per Semmes-Weinstein monofilament. Nail plates demonstrate onychomycosis.  Assessment: Onychomycosis bilateral.  Plan: We are going to request another liver profile. I also assigned another prescription for 90 Lamisil tablets. Follow-up with him in 4 months

## 2015-09-02 LAB — HEPATIC FUNCTION PANEL
ALBUMIN: 4.7 g/dL (ref 3.6–4.8)
ALK PHOS: 66 IU/L (ref 39–117)
ALT: 40 IU/L (ref 0–44)
AST: 32 IU/L (ref 0–40)
Bilirubin Total: 0.5 mg/dL (ref 0.0–1.2)
Bilirubin, Direct: 0.15 mg/dL (ref 0.00–0.40)
TOTAL PROTEIN: 7.1 g/dL (ref 6.0–8.5)

## 2015-09-14 ENCOUNTER — Telehealth: Payer: Self-pay | Admitting: *Deleted

## 2015-09-14 NOTE — Telephone Encounter (Addendum)
-----   Message from Garrel Ridgel, [place] sent at 09/14/2015  7:15 AM EST ----- LIVER PROFILE LOOKS GOOD AND MAY CONTINUE MEDICATION.  Orders called to pt.

## 2016-01-03 ENCOUNTER — Encounter: Payer: Self-pay | Admitting: Podiatry

## 2016-01-03 ENCOUNTER — Ambulatory Visit (INDEPENDENT_AMBULATORY_CARE_PROVIDER_SITE_OTHER): Payer: 59 | Admitting: Podiatry

## 2016-01-03 VITALS — BP 105/60 | HR 62 | Resp 16

## 2016-01-03 DIAGNOSIS — Z79899 Other long term (current) drug therapy: Secondary | ICD-10-CM

## 2016-01-03 DIAGNOSIS — B351 Tinea unguium: Secondary | ICD-10-CM | POA: Diagnosis not present

## 2016-01-03 MED ORDER — TERBINAFINE HCL 250 MG PO TABS: 250.0000 mg | ORAL_TABLET | Freq: Every day | ORAL | Status: DC

## 2016-01-03 NOTE — Progress Notes (Signed)
Presents today for chief complaint of painful elongated toenails that we are currently treating with Lamisil for fungus. He denies fever chills nausea vomiting muscle aches pains rashes or itching with the use of the medication. He states that his toenails are not nearly as yellow racing to be doing pretty well.  Objective: Vital signs are stable alert and oriented 3. Pulses are palpable. Toenails are improving by approximately 80% plates are clear with the exception of the distalmost aspect. Otherwise physical exam is unchanged.   Assessment: Long-term use of Lamisil for onychomycosis improving approximately 80%.  Plan: A 30 tablet 2 month pulse dose. Follow-up with him in 3 months.

## 2016-02-21 ENCOUNTER — Telehealth: Payer: Self-pay | Admitting: Pulmonary Disease

## 2016-02-21 NOTE — Telephone Encounter (Signed)
Spoke with pt. States that he fell off a ladder last week. He is having sharp pain in L side and L arm. When he fell off the ladder "it knocked the breath out of him." Has been taking Aleve for pain. Has not been using heat or ice for the pain. Would like have an xray to make sure nothing is broke.  SN - please advise. Thanks.

## 2016-02-21 NOTE — Telephone Encounter (Signed)
Per SN: If pt thinks arm is broken then he will need to be evaluated in the ED. Otherwise will need to see pt to evaluate pt's injuries and the need for xray.   Called and spoke to pt. Informed him of the recs per SN. Appt made with SN on 02/22/2016. Pt verbalized understanding and denied any further questions or concerns at this time.

## 2016-02-22 ENCOUNTER — Encounter: Payer: Self-pay | Admitting: Pulmonary Disease

## 2016-02-22 ENCOUNTER — Ambulatory Visit (INDEPENDENT_AMBULATORY_CARE_PROVIDER_SITE_OTHER): Payer: 59 | Admitting: Pulmonary Disease

## 2016-02-22 ENCOUNTER — Ambulatory Visit (INDEPENDENT_AMBULATORY_CARE_PROVIDER_SITE_OTHER)
Admission: RE | Admit: 2016-02-22 | Discharge: 2016-02-22 | Disposition: A | Discharge status: Home / Self Care | Payer: 59 | Source: Ambulatory Visit | Attending: Pulmonary Disease | Admitting: Pulmonary Disease

## 2016-02-22 VITALS — BP 130/68 | HR 59 | Temp 97.8°F | Ht 72.0 in | Wt 217.0 lb

## 2016-02-22 DIAGNOSIS — R0789 Other chest pain: Secondary | ICD-10-CM

## 2016-02-22 DIAGNOSIS — W11XXXA Fall on and from ladder, initial encounter: Secondary | ICD-10-CM | POA: Diagnosis not present

## 2016-02-22 MED ORDER — HYDROCODONE-ACETAMINOPHEN 5-325 MG PO TABS: 1.0000 | ORAL_TABLET | Freq: Four times a day (QID) | ORAL | Status: DC | PRN

## 2016-02-22 NOTE — Patient Instructions (Signed)
Today we updated your med list in our EPIC system...    Continue your current medications the same...  We wrote for Hydrocodone for pain- one tab every [place] as needed...  Use a heating pad etc as needed...  Get a RIB binder (or an Abdominal binder) for chest wall support when up & about...  Call for any questions...  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

## 2016-02-22 NOTE — Progress Notes (Signed)
Subjective:    Patient ID: Joe [name], male    DOB: [birth year], 65 y.o.   MRN: [id number]  HPI 65 y/o WM here for a follow up visit... he has multiple medical problems as noted below...  ~  SEE PREV EPIC NOTES FOR OLDER DATA >>    EKG 8/13 showed NSR, rate64, inferior scar, rsr' in V1...  LABS 8/13:  FLP- chol ok but TG=168;  Chems- ok w/ BS=140 A1c=6.3;  TSH=1.74;  PSA= 0.3   ~  June 18, 2013:  Yearly ROV & CPX>  Overall Joe [name] is feeling well- still working at the [employer] at [employer], gets plenty of exercise w/o difficulty...    CAD> on ASA81, Plavix75, Aten50; BP= 130/70 & he denies CP, palpit, SOB, edema, etc; exercise= walking a lot at work & asymptomatic...    Hyperlipid> on Lip80-1/2 tab daily; FLP 7/14 at work showed TChol 115, TG 85, HDL 38, LDL 60    DM> on Metform500-2Bid, Glimep2mg -1/2 daily; Labs at work 7/14 showed BS= 129, A1c= 6.4    Overweight> he has lost 7# down to 212# today; he gets exercise parking cars at [facility], but needs better diet!    Hypogonadism> hx bilat testic atrophy & Testos levels ~170; prev on Androgel 1.62% but he stopped it on his own due to change in behavior- more aggressive & irritable, didn't like the effects; states his energy is good, strength normal, feeling well...    DJD, right wrist pain> c/o right wrist pain & rec to do hot soaks, wear brace & use OTC Advil/ Aleve prn; we can refer to Ortho when needed... We reviewed prob list, meds, xrays and labs> see below for updates >>   EKG 9/14 showed NSR, rate62, infer scar, RBBB, NAD...   LABS 9/14:  Pending... (he never got these labs done)  ~  June 18, 2014:  Yearly ROV & CPX> Joe [name] reports a good yr- no new complaints or concerns... He continues to work as a Engineer, water at the [employer] gets plenty of exercise doing this... We reviewed the following medical problems during today's office visit >>     CAD> on ASA81, Plavix75, Aten50; BP=  110/62 & he denies CP, palpit, SOB, edema, etc; exercise= walking a lot at work & asymptomatic...    Hyperlipid> on Lip80-1/2 tab daily; FLP 8/15 at work showed TChol 115, TG 167, HDL 32, LDL 50 and we reviewed low fat diet & exercise program...    DM> on Metform500-2Bid, Glimep2mg -1/2 daily; Labs at work 8/15 showed BS= 81, A1c= 6.1 & I told him if he lost more weight we could decr his DM meds...    Overweight> he has lost 5# more down to 207# today; he gets exercise parking cars at [facility], but needs better diet!    Hypogonadism> hx bilat testic atrophy & Testos levels ~170; prev on Androgel 1.62% but he stopped it on his own due to change in behavior- more aggressive & irritable, didn't like the effects; states his energy is good, strength normal, feeling well...    DJD, right wrist pain> he had hx right wrist pain & rec to do hot soaks, wear brace & use OTC Advil/ Aleve prn=> resolved & gets about well...  We reviewed prob list, meds, xrays and labs> see below for updates >> he will get 2015 Flu vaccine at [facility]...  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 We reviewed the following medical problems during today's office visit >>     CAD> on ASA81, Plavix75, Aten50; BP= 104/60 & he denies CP, palpit, SOB, edema, etc; exercise= walking a lot at work & asymptomatic...    Hyperlipid> on Lip80-1/2 tab daily; FLP 9/16 at work showed TChol 114, TG 176, HDL 32, LDL 46 and we reviewed low fat diet & exercise program...    DM> on Metform500-2Bid, Glimep2mg -1/2 daily; Labs at work 9/16 showed BS= 108, A1c= 6.4 & I  told him if he lost more weight we could decr his DM meds...    Overweight> wt is stable at 207# today; he gets exercise parking cars at [facility], but needs better diet!    Hypogonadism> hx bilat testic atrophy & Testos levels ~170; prev on Androgel 1.62% but he stopped it on his own due to change in behavior- more aggressive & irritable, didn't like the effects; states his energy is good, strength normal, feeling well...    DJD, right wrist pain> he had hx right wrist pain & rec to do hot soaks, wear brace & use OTC Advil/ Aleve prn=> resolved & gets about well...  We reviewed prob list, meds, xrays and labs> see below for updates >> he was advised he needs CBC, TSH, PSA but he declines additional labs  CXR 06/24/15 showed norm heart size, mild bibasilar pleuroparenchymal scarring, no change and NAD...   LABS 06/2015 from [facility] at work (he refused full labs here in our office)> FLP- TChol 114, TG 176, HDL 32, LDL 46;  BS=108, A1c=6.4;  Cr=0.85   ~  Feb 22, 2016:  50mo ROV & add-on appt requested by pt after a fall from a ladder ~1wk ago;  He was cleaning his gutters and the ladder slipped, he grabbed the gutter which slowed his fall, landed on left side w/ c/o sore left post ribs, left arm, bilat hands, & leg bruised; he has good ROM, breathing is Ok w/o dyspnea but sore/ tender on left chest; Note- he did not go to the ER for eval...  EXAM shows Afeb, VSS, O2sat=100% on RA;  HEENT- neg, mallamapti2;  Chest- tender left chest wall post-lat, clear w/o w/r/r;  Heart- RR w/o m/r/g;  Abd- soft, nontender, neg;  Ext- bruises, w/o c/c/e;  Neuro- intact...  CXR & left rib details 02/22/16>  No fx seen, norm heart size, min scarring left lat lung base, sl prom interstitial markings but clear- NAD... IMP/PLAN>>  Fall from ladder w/ left chest wall trauma=> rest, heat/ice, rib binder, Vicodin prn...           Problem List:  CAD (ICD-414.00) - presented w/ CP in 2001 w/ IWMI and cath  ([place]- DrParaschos) showing a 90% RCA lesion- s/p PTCA/ stent... he also had some mild LAD dis 20-30% at that time... seen 2005 by DrCrenshaw... he is on ASA 81mg /d, PLAVIX 75mg /d, & ATENOLOL 50mg /d... ~  NuclearStressTest 7/05 showed inferolat infarct, no ischemia, impaired exerc capacity, EF=60%.  ~  8/10:  Baseline EKG showed NSR, sl IVCD, inferior scar, NAD; doing  well- denies CP, palpit, SOB... walking daily w/o problems... ~  8/11: he remains asymptomatic but I have encouraged Cards f/u appt (he never did). ~  2/12:  He saw TP w/ Bronchitis> CXR was clear, wnl... ~  8/12:  EKG showed NSR, RBBB ~  EKG 8/13 showed NSR, rate64, inferior scar, rsr' in V1... ~  9/14: on ASA81, Plavix75, Aten50; BP= 130/70 & he denies CP, palpit, SOB, edema, etc; exercise= walking a lot at work & asymptomatic; EKG showed NSR, rate62, infer scar, RBBB, NAD...  ~  9/15: on ASA81, Plavix75, Aten50; BP= 110/62 & he denies CP, palpit, SOB, edema, etc; exercise= walking a lot at work & asymptomatic. ~  9/16: on ASA81, Plavix75, Aten50; BP= 104/60 & he denies CP, palpit, SOB, edema, etc; exercise= walking a lot at work & asymptomatic.  HYPERLIPIDEMIA (ICD-272.4) - on LIPITOR 80mg - taking 1/2 tab daily... Labs all done at [facility]> ~  FLP 6/08 showed TChol 142, TG 207, HDL 32, LDL 69... told to work on diet & weight... ~  [place] 6/09 at [facility] showed TChol 140, TG 144, HDL 32, LDL 79... improved, continue Lipitor + diet. ~  FLP 8/10 at [facility] showed TChol 145, TG 167, HDL 33, LDL 79 ~  FLP 8/11 at [facility] on Lip40 showed TChol 136, TG 125, HDL 40, LDL 71... Continue same. ~  [place] 8/12 at [facility] [place] showed TChol 127, TG 170, HDL 32, LDL 61 ~  FLP 8/13 at [facility] on Lip40 showed TChol 143, TG 168, HDL 35, LDL 74 ~  9/14: on Lip80-1/2 tab daily; FLP 7/14 at work showed TChol 115, TG 85, HDL 38, LDL 60 ~  9/15: on Lip80-1/2 tab daily; FLP 8/15 at work showed TChol 115, TG 167, HDL 32, LDL 50 and we reviewed low fat  diet & exercise program... ~  9/16: on Lip80-1/2 tab daily; FLP 9/16 at work showed TChol 114, TG 176, HDL 32, LDL 46 and we reviewed low fat diet & exercise program.  DIABETES MELLITUS (ICD-250.00) - on METFORMIN 500mg -2Bid, and GLIMEPIRIDE 4mg - 1/2 qd; needs better diet & get weight down. ~  labs 6/08 showed BS= 161, HgA1c= not done by [facility]... On Metform500Bid. ~  labs 6/09 showed BS= 90, HgA1c= 6.0.Marland Kitchen. stay on diet, and decr the Glimepiride to 1/2 tab Qam... ~  8/10:  states BS at home are "OK"; labs at [facility] showed BS= 94, A1c not done. ~  8/11:  states BS are OK when he checks them, [facility] labs showed BS= 149, A1c= 8.0 ~  8/12:  states BS remains OK at home, but Labs showed BS=220, A1c=9.0.Marland KitchenMarland Kitchen meds increased, get on diet, etc... ~  6/13:  Eye exam by DrNice in > no diabetic retinopathy... ~  8/13:  Labs at [facility] on Metform500-2Bid+Glimep2 showed BS=140, A1c=6.3 ~  9/14:  on Metform500Bid, Glimep2mg -1/2 daily; Labs at work 7/14 showed BS= 129, A1c= 6.4  ~  9/15: on Metform500-2Bid, Glimep2mg -1/2 daily; Labs at work 8/15 showed BS= 81, A1c= 6.1 & I told him if he lost more weight we could decr his DM meds. ~  9/16: on Metform500-2Bid, Glimep2mg -1/2 daily; Labs at work 9/16 showed BS= 108, A1c= 6.4; continue same, get wt down.  OVERWEIGHT (ICD-278.02) - we reviewed diet+exercise needed for weight reduction. ~  weight in the 2000 decade varied betw 225-250.Marland Kitchen. ~  weight 6/09 = 231# ~  weight 8/10 = 237# ~  weight 8/11 = 229# ~  Weight 8/12 = 231# ~  Weight 8/13 = 218#... Keep up the good work. ~  Weight 9/14 = 212# ~  Weight 9/15 = 207# ~  Weight 9/16 = 207#  COLONOSCOPY - He had a normal colonoscopy 3/07 by DrPatterson...  FATTY LIVER DISEASE (ICD-571.8) - LFT's sl elevated in the past...  ~  LFT's on labs since 2008 have all been WNL...  HYPOGONADISM, MALE (ICD-257.2) - he has atrophic testes and low testosterone levels; on Rx w/ ANDROGEL 5gm/d rubbed into skin  daily... ~  Labs 2007 showed Testosterone level = 177 (350-900) ~  Labs at [facility] 8/10 showed Testosterone = 171 (280-800) & rec to use the Angdrogel daily; PSA= 0.4 ~  labs at [facility] 8/11 showed Testosterone level = 125, PSA= 0.5 ~  Labs from [facility] 8/12 showed PSA= 0.4 (Testos not done)... ~  Labs from [facility] 8/13 showed PSA= 0.3 and pt indicates that he's stopped Androgel on his own... ~  9/14:  hx bilat testic atrophy & Testos levels ~170; prev on Androgel 1.62% but he stopped it on his own due to change in behavior- more aggressive & irritable, didn't like the effects; states his energy is good, strength normal, feeling well.  Health Maintenance >> he is a never smoker; quit Etoh in the 70's... ~  GI:  He had neg colon 2007, f/u due 10 yrs... ~  GU:  Atrophic testes> prev on Androgel but pt stopped on his own; has ED> not currently on RX. ~  Immuniz:  s/p Tetanus shot in 6/08; s/p Pneumovax 6/09; he does not want the Flu vaccine;   Past Surgical History  Procedure Laterality Date  . Inguinal hernia repair      right    Outpatient Encounter Prescriptions as of 02/22/2016  Medication Sig  . aspirin 81 MG tablet Take 81 mg by mouth daily.    Marland Kitchen atenolol (TENORMIN) 50 MG tablet Take 1 tablet by mouth  daily  . atorvastatin (LIPITOR) 80 MG tablet Take 0.5 tablets (40 mg total) by mouth daily.  . clopidogrel (PLAVIX) 75 MG tablet Take 1 tablet (75 mg total) by mouth daily.  Marland Kitchen glimepiride (AMARYL) 2 MG tablet Take 1/2 tablet by mouth daily  . metFORMIN (GLUCOPHAGE) 500 MG tablet Take 2 tablets (1,000 mg  total) by mouth 2 times  daily with a meal.  . terbinafine (LAMISIL) 250 MG tablet Take 1 tablet (250 mg total) by mouth daily.  Marland Kitchen HYDROcodone-acetaminophen (NORCO/VICODIN) 5-325 MG tablet Take 1 tablet by mouth every 6 (six) hours as needed for moderate pain.  . [DISCONTINUED] terbinafine (LAMISIL) 250 MG tablet Take 1 tablet (250 mg total) by mouth daily. (Patient not taking: Reported  on 02/22/2016)   No facility-administered encounter medications on file as of 02/22/2016.    No Known Allergies    Immunization History  Administered Date(s) Administered  . Influenza Split 07/17/2011, 07/18/2012  . Influenza Whole 07/28/2008, 08/05/2009, 07/16/2010  . Influenza-Unspecified 07/17/2015  . Pneumococcal Polysaccharide-23 04/09/2008  . Tdap 08/17/2011    Current Medications, Allergies, Past Medical History, Past Surgical History, Family History, and Social History were reviewed in [employer] record.    Review of Systems        The patient complains of joint pain and stiffness.  The patient denies fever, chills, sweats, anorexia, fatigue, weakness, malaise, weight loss, sleep disorder, blurring, diplopia, eye irritation, eye discharge, vision loss, eye pain, photophobia, earache, ear discharge, tinnitus, decreased hearing, nasal congestion, nosebleeds, sore throat, hoarseness, chest pain, palpitations, syncope,  dyspnea on exertion, orthopnea, PND, peripheral edema, cough, dyspnea at rest, excessive sputum, hemoptysis, wheezing, pleurisy, nausea, vomiting, diarrhea, constipation, change in bowel habits, abdominal pain, melena, hematochezia, jaundice, gas/bloating, indigestion/heartburn, dysphagia, odynophagia, dysuria, hematuria, urinary frequency, urinary hesitancy, nocturia, incontinence, back pain, joint swelling, muscle cramps, muscle weakness, arthritis, sciatica, restless legs, leg pain at night, leg pain with exertion, rash, itching, dryness, suspicious lesions, paralysis, paresthesias, seizures, tremors, vertigo, transient blindness, frequent falls, frequent headaches, difficulty walking, depression, anxiety, memory loss, confusion, cold intolerance, heat intolerance, polydipsia, polyphagia, polyuria, unusual weight change, abnormal bruising, bleeding, enlarged lymph nodes, urticaria, allergic rash, hay fever, and recurrent infections.     Objective:    Physical Exam      WD, Overweight, 65 y/o WM in NAD... GENERAL:  Alert & oriented; pleasant & cooperative... HEENT:  [place]/AT, EOM-wnl, PERRLA, EACs-clear, TMs-wnl, NOSE-clear, THROAT-clear & wnl. NECK:  Supple w/ full ROM; no JVD; normal carotid impulses w/o bruits; no thyromegaly or nodules palpated; no lymphadenopathy. CHEST:  Clear to P & A; without wheezes/ rales/ or rhonchi. HEART:  Regular Rhythm; without murmurs/ rubs/ or gallops. ABDOMEN:  Soft & nontender; normal bowel sounds; no organomegaly or masses detected. RECTAL:  Neg - prostate 2+ rounded & nontender w/o nodules; stool hematest neg; atophic testes bilat... EXT: without deformities, mild arthritic changes; no varicose veins/ venous insuffic/ or edema. decr ROM left shoulder noted... NEURO:  CN's intact; motor testing normal; sensory testing normal; gait normal & balance OK. DERM:  mild intertrig rash noted...  RADIOLOGY DATA:  Reviewed in the EPIC EMR & discussed w/ the patient...  LABORATORY DATA:  Reviewed in the EPIC EMR & discussed w/ the patient...   Assessment & Plan:    Fall from ladder w/ left CWP>  No fx apparent & Rx w/ rest, heat/ice, rib binder, Vicodin prn.   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  HYPERLIPID>  On Lip40 & FLPs have looked good; needs better diet (low fat) & get weight down...  DM>  Stable on Metform+Glimep; Diet has not been optimal but he has lost some weight this yr; we reviewed DIET, EXERCISE, & get wt down!   OBESITY>  Diet & exercise are the keys...  GU>  Hypogonadism, ED>  Prev on Androgel (pt stopped on his own due to irritability & aggressive) & states that his energy is good, feels well etc...  Other medical problems as noted...   Patient's Medications  New Prescriptions   HYDROCODONE-ACETAMINOPHEN (NORCO/VICODIN) 5-325 MG TABLET    Take 1 tablet  by mouth every 6 (six) hours as needed for moderate pain.  Previous Medications   ASPIRIN 81 MG TABLET    Take 81 mg by mouth daily.     ATENOLOL (TENORMIN) 50 MG TABLET    Take 1 tablet by mouth  daily   ATORVASTATIN (LIPITOR) 80 MG TABLET    Take 0.5 tablets (40 mg total) by mouth daily.   CLOPIDOGREL (PLAVIX) 75 MG TABLET    Take 1 tablet (75 mg total) by mouth daily.   GLIMEPIRIDE (AMARYL) 2 MG TABLET    Take 1/2 tablet by mouth daily   METFORMIN (GLUCOPHAGE) 500 MG TABLET    Take 2 tablets (1,000 mg  total) by mouth 2 times  daily with a meal.   TERBINAFINE (LAMISIL) 250 MG TABLET    Take 1 tablet (250 mg total) by  mouth daily.  Modified Medications   No medications on file  Discontinued Medications   TERBINAFINE (LAMISIL) 250 MG TABLET    Take 1 tablet (250 mg total) by mouth daily.

## 2016-02-29 NOTE — Progress Notes (Signed)
Quick Note:  [id number] ______ 

## 2016-03-02 ENCOUNTER — Telehealth: Payer: Self-pay | Admitting: Pulmonary Disease

## 2016-03-02 NOTE — Telephone Encounter (Signed)
Spoke with pt and advised of cxr results per Dr Lenna Gilford.

## 2016-04-10 ENCOUNTER — Ambulatory Visit: Payer: 59 | Admitting: Podiatry

## 2016-04-11 ENCOUNTER — Other Ambulatory Visit: Payer: Self-pay | Admitting: Pulmonary Disease

## 2016-06-01 ENCOUNTER — Encounter: Payer: Self-pay | Admitting: Pulmonary Disease

## 2016-06-01 LAB — HM DIABETES EYE EXAM

## 2016-06-26 ENCOUNTER — Encounter: Payer: Self-pay | Admitting: Gastroenterology

## 2016-06-26 ENCOUNTER — Encounter: Payer: Self-pay | Admitting: Pulmonary Disease

## 2016-06-26 ENCOUNTER — Ambulatory Visit (INDEPENDENT_AMBULATORY_CARE_PROVIDER_SITE_OTHER): Payer: 59 | Admitting: Pulmonary Disease

## 2016-06-26 VITALS — BP 124/70 | HR 56 | Temp 97.5°F | Ht 72.0 in | Wt 217.0 lb

## 2016-06-26 DIAGNOSIS — E785 Hyperlipidemia, unspecified: Secondary | ICD-10-CM | POA: Diagnosis not present

## 2016-06-26 DIAGNOSIS — E663 Overweight: Secondary | ICD-10-CM

## 2016-06-26 DIAGNOSIS — I2510 Atherosclerotic heart disease of native coronary artery without angina pectoris: Secondary | ICD-10-CM | POA: Diagnosis not present

## 2016-06-26 DIAGNOSIS — Z0000 Encounter for general adult medical examination without abnormal findings: Secondary | ICD-10-CM | POA: Diagnosis not present

## 2016-06-26 DIAGNOSIS — E119 Type 2 diabetes mellitus without complications: Secondary | ICD-10-CM | POA: Diagnosis not present

## 2016-06-26 MED ORDER — GLIMEPIRIDE 2 MG PO TABS: 1.0000 mg | ORAL_TABLET | Freq: Every day | ORAL | 3 refills | Status: DC

## 2016-06-26 MED ORDER — ATENOLOL 50 MG PO TABS: 50.0000 mg | ORAL_TABLET | Freq: Every day | ORAL | 3 refills | Status: DC

## 2016-06-26 MED ORDER — ATORVASTATIN CALCIUM 80 MG PO TABS: 40.0000 mg | ORAL_TABLET | Freq: Every day | ORAL | 3 refills | Status: DC

## 2016-06-26 MED ORDER — METFORMIN HCL 500 MG PO TABS: ORAL_TABLET | ORAL | 3 refills | Status: DC

## 2016-06-26 MED ORDER — CLOPIDOGREL BISULFATE 75 MG PO TABS: 75.0000 mg | ORAL_TABLET | Freq: Every day | ORAL | 3 refills | Status: DC

## 2016-06-26 NOTE — Progress Notes (Signed)
Subjective:    Patient ID: Joe [name], male    DOB: [birth year], 65 y.o.   MRN: [id number]  HPI 65 y/o WM here for a follow up visit... he has multiple medical problems as noted below...  ~  SEE PREV EPIC NOTES FOR OLDER DATA >>    EKG 8/13 showed NSR, rate64, inferior scar, rsr' in V1...  LABS 8/13:  FLP- chol ok but TG=168;  Chems- ok w/ BS=140 A1c=6.3;  TSH=1.74;  PSA= 0.3   ~  June 18, 2013:  Yearly ROV & CPX>  Overall Joe [name] is feeling well- still working at the [employer] at [employer], gets plenty of exercise w/o difficulty...    CAD> on ASA81, Plavix75, Aten50; BP= 130/70 & he denies CP, palpit, SOB, edema, etc; exercise= walking a lot at work & asymptomatic...    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    Hypogonadism> hx bilat testic atrophy & Testos levels ~170; prev on Androgel 1.62% but he stopped it on his own due to change in behavior- more aggressive & irritable, didn't like the effects; states his energy is good, strength normal, feeling well...    DJD, right wrist pain> c/o right wrist pain & rec to do hot soaks, wear brace & use OTC Advil/ Aleve prn; we can refer to Ortho when needed... We reviewed prob list, meds, xrays and labs> see below for updates >>   EKG 9/14 showed NSR, rate62, infer scar, RBBB, NAD...   LABS 9/14:  Pending... (he never got these labs done)  ~  June 18, 2014:  Yearly ROV & CPX> Joe [name] reports a good yr- no new complaints or concerns... He continues to work as a Engineer, water at the [employer] gets plenty of exercise doing this... We reviewed the following medical problems during today's office visit >>     CAD> on ASA81, Plavix75, Aten50; BP=  110/62 & he denies CP, palpit, SOB, edema, etc; exercise= walking a lot at work & asymptomatic...    Hyperlipid> on Lip80-1/2 tab daily; FLP 8/15 at work showed TChol 115, TG 167, HDL 32, LDL 50 and we reviewed low fat diet & exercise program...    DM> on Metform500-2Bid, Glimep2mg -1/2 daily; Labs at work 8/15 showed BS= 81, A1c= 6.1 & I told him if he lost more weight we could decr his DM meds...    Overweight> he has lost 5# more down to 207# today; he gets exercise parking cars at [facility], but needs better diet!    Hypogonadism> hx bilat testic atrophy & Testos levels ~170; prev on Androgel 1.62% but he stopped it on his own due to change in behavior- more aggressive & irritable, didn't like the effects; states his energy is good, strength normal, feeling well...    DJD, right wrist pain> he had hx right wrist pain & rec to do hot soaks, wear brace & use OTC Advil/ Aleve prn=> resolved & gets about well...  We reviewed prob list, meds, xrays and labs> see below for updates >> he will get 2015 Flu vaccine at [facility]...  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 We reviewed the following medical problems during today's office visit >>     CAD> on ASA81, Plavix75, Aten50; BP= 104/60 & he denies CP, palpit, SOB, edema, etc; exercise= walking a lot at work & asymptomatic...    Hyperlipid> on Lip80-1/2 tab daily; FLP 9/16 at work showed TChol 114, TG 176, HDL 32, LDL 46 and we reviewed low fat diet & exercise program...    DM> on Metform500-2Bid, Glimep2mg -1/2 daily; Labs at work 9/16 showed BS= 108, A1c= 6.4 & I  told him if he lost more weight we could decr his DM meds...    Overweight> wt is stable at 207# today; he gets exercise parking cars at [facility], but needs better diet!    Hypogonadism> hx bilat testic atrophy & Testos levels ~170; prev on Androgel 1.62% but he stopped it on his own due to change in behavior- more aggressive & irritable, didn't like the effects; states his energy is good, strength normal, feeling well...    DJD, right wrist pain> he had hx right wrist pain & rec to do hot soaks, wear brace & use OTC Advil/ Aleve prn=> resolved & gets about well...  We reviewed prob list, meds, xrays and labs> see below for updates >> he was advised he needs CBC, TSH, PSA but he declines additional labs  CXR 06/24/15 showed norm heart size, mild bibasilar pleuroparenchymal scarring, no change and NAD...   LABS 06/2015 from [facility] at work (he refused full labs here in our office)> FLP- TChol 114, TG 176, HDL 32, LDL 46;  BS=108, A1c=6.4;  Cr=0.85   ~  Feb 22, 2016:  36mo ROV & add-on appt requested by pt after a fall from a ladder ~1wk ago;  He was cleaning his gutters and the ladder slipped, he grabbed the gutter which slowed his fall, landed on left side w/ c/o sore left post ribs, left arm, bilat hands, & leg bruised; he has good ROM, breathing is Ok w/o dyspnea but sore/ tender on left chest; Note- he did not go to the ER for eval...  EXAM shows Afeb, VSS, O2sat=100% on RA;  HEENT- neg, mallamapti2;  Chest- tender left chest wall post-lat, clear w/o w/r/r;  Heart- RR w/o m/r/g;  Abd- soft, nontender, neg;  Ext- bruises, w/o c/c/e;  Neuro- intact...  CXR & left rib details 02/22/16>  No fx seen, norm heart size, min scarring left lat lung base, sl prom interstitial markings but clear- NAD... IMP/PLAN>>  Fall from ladder w/ left chest wall trauma=> rest, heat/ice, rib binder, Vicodin prn...  ~  June 26, 2016:  2mo ROV & time for Don's annual CPX>  Joe [name] reports a good year overall,  just the fall from the ladder in May, now fully recovered- no new complaints or concerns, he is still parking cars at [employer] he says this is a lot of daily exercise!Marland Kitchen.. we reviewed the following medical problems during today's office visit >>     CAD> on ASA81, Plavix75, Aten50; BP= 124/70 & he denies CP, palpit, SOB, edema, etc; exercise=  walking a lot at work & asymptomatic...    Hyperlipid> on Lip80-1/2 tab daily; FLP 8/17 at work showed TChol 135, TG 256, HDL 32, LDL 52 and we reviewed low fat diet & exercise program...    DM> on Metform500-2Bid, Glimep2mg -1/2 daily; Labs at work 8/17 showed BS= 109, A1c= 7.4 & I told him that if he doesn't lose the wt we'll have to add meds!    Overweight> wt is up to 217# today; he gets exercise parking cars at [facility], but needs better diet!    He had a normal colonoscopy 12/2005 by DrPatterson & due for f/u study, we will refer...    Hypogonadism> hx bilat testic atrophy & Testos levels ~170; prev on Androgel 1.62% but he stopped it on his own due to change in behavior- more aggressive & irritable, didn't like the effects; states his energy is good, strength normal, feeling well...    DJD, right wrist pain> he had hx right wrist pain & rec to do hot soaks, wear brace & use OTC Advil/ Aleve prn=> resolved & gets about well...  EXAM shows Afeb, VSS, O2sat=99% on RA;  HEENT- neg, mallamapti2;  Chest- tender left chest wall post-lat, clear w/o w/r/r;  Heart- RR w/o m/r/g;  Abd- soft, nontender, neg;  Ext- bruises, w/o c/c/e;  Neuro- intact...  LABS done at [place] 06/09/16>  [place];  BS=109, A1c=7.4;  Cr=0.85...  LABS done at [facility] 06/26/16>  Chems/LFTs- wnl;  CBC- wnl;  TSH=1.46;  PSA=0.4  EKG 06/26/16>  SBrady, rate52, RBBB, inferior Qs, NAD...  IMP/PLAN>>  Joe [name] is doing well, we will refer him to GI for colonoscopy due; we reviewed better low carb, low fat diet & he understands that this must result in wt reducton!  Same meds  for now, call for questions or problems...            Problem List:  CAD (ICD-414.00) - presented w/ CP in 2001 w/ IWMI and cath ([place]- DrParaschos) showing a 90% RCA lesion- s/p PTCA/ stent... he also had some mild LAD dis 20-30% at that time... seen 2005 by DrCrenshaw... he is on ASA 81mg /d, PLAVIX 75mg /d, & ATENOLOL 50mg /d... ~  NuclearStressTest 7/05 showed inferolat infarct, no ischemia, impaired exerc capacity, EF=60%.  ~  8/10:  Baseline EKG showed NSR, sl IVCD, inferior scar, NAD; doing well- denies CP, palpit, SOB... walking daily w/o problems... ~  8/11: he remains asymptomatic but I have encouraged Cards f/u appt (he never did). ~  2/12:  He saw TP w/ Bronchitis> CXR was clear, wnl... ~  8/12:  EKG showed NSR, RBBB ~  EKG 8/13 showed NSR, rate64, inferior scar, rsr' in V1... ~  9/14: on ASA81, Plavix75, Aten50; BP= 130/70 & he denies CP, palpit, SOB, edema, etc; exercise= walking a lot at work & asymptomatic; EKG showed NSR, rate62, infer scar, RBBB, NAD...  ~  9/15: on ASA81, Plavix75, Aten50; BP= 110/62 & he denies CP, palpit, SOB, edema, etc; exercise= walking a lot at work & asymptomatic. ~  9/16: on ASA81, Plavix75, Aten50; BP= 104/60 & he denies CP, palpit, SOB, edema, etc; exercise= walking a lot at work & asymptomatic.  HYPERLIPIDEMIA (ICD-272.4) - on LIPITOR 80mg - taking 1/2 tab daily... Labs all done at [facility]> ~  FLP 6/08 showed TChol 142, TG 207, HDL 32, LDL 69... told to work on diet & weight... ~  [place] 6/09 at [facility] showed TChol 140, TG 144, HDL 32, LDL 79... improved, continue Lipitor +  diet. ~  FLP 8/10 at [facility] showed TChol 145, TG 167, HDL 33, LDL 79 ~  FLP 8/11 at [facility] on Lip40 showed TChol 136, TG 125, HDL 40, LDL 71... Continue same. ~  [place] 8/12 at [facility] on [place] showed TChol 127, TG 170, HDL 32, LDL 61 ~  FLP 8/13 at [facility] on Lip40 showed TChol 143, TG 168, HDL 35, LDL 74 ~  9/14: on Lip80-1/2 tab daily; FLP 7/14 at work showed TChol 115, TG 85,  HDL 38, LDL 60 ~  9/15: on Lip80-1/2 tab daily; FLP 8/15 at work showed TChol 115, TG 167, HDL 32, LDL 50 and we reviewed low fat diet & exercise program... ~  9/16: on Lip80-1/2 tab daily; FLP 9/16 at work showed TChol 114, TG 176, HDL 32, LDL 46 and we reviewed low fat diet & exercise program.  DIABETES MELLITUS (ICD-250.00) - on METFORMIN 500mg -2Bid, and GLIMEPIRIDE 4mg - 1/2 qd; needs better diet & get weight down. ~  labs 6/08 showed BS= 161, HgA1c= not done by [facility]... On Metform500Bid. ~  labs 6/09 showed BS= 90, HgA1c= 6.0.Marland Kitchen. stay on diet, and decr the Glimepiride to 1/2 tab Qam... ~  8/10:  states BS at home are "OK"; labs at [facility] showed BS= 94, A1c not done. ~  8/11:  states BS are OK when he checks them, [facility] labs showed BS= 149, A1c= 8.0 ~  8/12:  states BS remains OK at home, but Labs showed BS=220, A1c=9.0.Marland KitchenMarland Kitchen meds increased, get on diet, etc... ~  6/13:  Eye exam by DrNice in [place]> no diabetic retinopathy... ~  8/13:  Labs at [facility] on Metform500-2Bid+Glimep2 showed BS=140, A1c=6.3 ~  9/14:  on Metform500Bid, Glimep2mg -1/2 daily; Labs at work 7/14 showed BS= 129, A1c= 6.4  ~  9/15: on Metform500-2Bid, Glimep2mg -1/2 daily; Labs at work 8/15 showed BS= 81, A1c= 6.1 & I told him if he lost more weight we could decr his DM meds. ~  9/16: on Metform500-2Bid, Glimep2mg -1/2 daily; Labs at work 9/16 showed BS= 108, A1c= 6.4; continue same, get wt down.  OVERWEIGHT (ICD-278.02) - we reviewed diet+exercise needed for weight reduction. ~  weight in the 2000 decade varied betw 225-250.Marland Kitchen. ~  weight 6/09 = 231# ~  weight 8/10 = 237# ~  weight 8/11 = 229# ~  Weight 8/12 = 231# ~  Weight 8/13 = 218#... Keep up the good work. ~  Weight 9/14 = 212# ~  Weight 9/15 = 207# ~  Weight 9/16 = 207#  COLONOSCOPY - He had a normal colonoscopy 3/07 by DrPatterson...  FATTY LIVER DISEASE (ICD-571.8) - LFT's sl elevated in the past...  ~  LFT's on labs since 2008 have all been  WNL...  HYPOGONADISM, MALE (ICD-257.2) - he has atrophic testes and low testosterone levels; on Rx w/ ANDROGEL 5gm/d rubbed into skin daily... ~  Labs 2007 showed Testosterone level = 177 (350-900) ~  Labs at [facility] 8/10 showed Testosterone = 171 (280-800) & rec to use the Angdrogel daily; PSA= 0.4 ~  labs at [facility] 8/11 showed Testosterone level = 125, PSA= 0.5 ~  Labs from [facility] 8/12 showed PSA= 0.4 (Testos not done)... ~  Labs from [facility] 8/13 showed PSA= 0.3 and pt indicates that he's stopped Androgel on his own... ~  9/14:  hx bilat testic atrophy & Testos levels ~170; prev on Androgel 1.62% but he stopped it on his own due to change in behavior- more aggressive & irritable, didn't like the effects; states his  energy is good, strength normal, feeling well.  Health Maintenance >> he is a never smoker; quit Etoh in the 70's... ~  GI:  He had neg colon 2007, f/u due 10 yrs... ~  GU:  Atrophic testes> prev on Androgel but pt stopped on his own; has ED> not currently on RX. ~  Immuniz:  s/p Tetanus shot in 6/08; s/p Pneumovax 6/09; he does not want the Flu vaccine;   Past Surgical History:  Procedure Laterality Date  . INGUINAL HERNIA REPAIR     right    Outpatient Encounter Prescriptions as of 06/26/2016  Medication Sig Dispense Refill  . aspirin 81 MG tablet Take 81 mg by mouth daily.      Marland Kitchen atenolol (TENORMIN) 50 MG tablet Take 1 tablet (50 mg total) by mouth daily. 90 tablet 3  . atorvastatin (LIPITOR) 80 MG tablet Take 0.5 tablets (40 mg total) by mouth daily. 90 tablet 3  . clopidogrel (PLAVIX) 75 MG tablet Take 1 tablet (75 mg total) by mouth daily. 90 tablet 3  . glimepiride (AMARYL) 2 MG tablet Take 0.5 tablets (1 mg total) by mouth daily. 45 tablet 3  . HYDROcodone-acetaminophen (NORCO/VICODIN) 5-325 MG tablet Take 1 tablet by mouth every 6 (six) hours as needed for moderate pain. 30 tablet 0  . metFORMIN (GLUCOPHAGE) 500 MG tablet Take 2 tablets by mouth two times daily  with meals 360 tablet 3  . [DISCONTINUED] atenolol (TENORMIN) 50 MG tablet Take 1 tablet by mouth  daily 90 tablet 3  . [DISCONTINUED] atorvastatin (LIPITOR) 80 MG tablet Take 0.5 tablets (40 mg total) by mouth daily. 90 tablet 3  . [DISCONTINUED] clopidogrel (PLAVIX) 75 MG tablet Take 1 tablet by mouth  daily 90 tablet 3  . [DISCONTINUED] glimepiride (AMARYL) 2 MG tablet Take one-half tablet by  mouth daily 45 tablet 3  . [DISCONTINUED] metFORMIN (GLUCOPHAGE) 500 MG tablet Take 2 tablets by mouth two times daily with meals 360 tablet 3  . [DISCONTINUED] terbinafine (LAMISIL) 250 MG tablet Take 1 tablet (250 mg total) by mouth daily. (Patient not taking: Reported on 06/26/2016) 90 tablet 0   No facility-administered encounter medications on file as of 06/26/2016.     No Known Allergies    Immunization History  Administered Date(s) Administered  . Influenza Split 07/17/2011, 07/18/2012  . Influenza Whole 07/28/2008, 08/05/2009, 07/16/2010  . Influenza-Unspecified 07/17/2015  . Pneumococcal Polysaccharide-23 04/09/2008  . Tdap 08/17/2011    Current Medications, Allergies, Past Medical History, Past Surgical History, Family History, and Social History were reviewed in [employer] record.    Review of Systems        The patient complains of joint pain and stiffness.  The patient denies fever, chills, sweats, anorexia, fatigue, weakness, malaise, weight loss, sleep disorder, blurring, diplopia, eye irritation, eye discharge, vision loss, eye pain, photophobia, earache, ear discharge, tinnitus, decreased hearing, nasal congestion, nosebleeds, sore throat, hoarseness, chest pain, palpitations, syncope, dyspnea on exertion, orthopnea, PND, peripheral edema, cough, dyspnea at rest, excessive sputum, hemoptysis, wheezing, pleurisy, nausea, vomiting, diarrhea, constipation, change in bowel habits, abdominal pain, melena, hematochezia, jaundice, gas/bloating,  indigestion/heartburn, dysphagia, odynophagia, dysuria, hematuria, urinary frequency, urinary hesitancy, nocturia, incontinence, back pain, joint swelling, muscle cramps, muscle weakness, arthritis, sciatica, restless legs, leg pain at night, leg pain with exertion, rash, itching, dryness, suspicious lesions, paralysis, paresthesias, seizures, tremors, vertigo, transient blindness, frequent falls, frequent headaches, difficulty walking, depression, anxiety, memory loss, confusion, cold intolerance, heat intolerance, polydipsia, polyphagia, polyuria, unusual weight  change, abnormal bruising, bleeding, enlarged lymph nodes, urticaria, allergic rash, hay fever, and recurrent infections.     Objective:   Physical Exam      WD, Overweight, 65 y/o WM in NAD... GENERAL:  Alert & oriented; pleasant & cooperative... HEENT:  [place]/AT, EOM-wnl, PERRLA, EACs-clear, TMs-wnl, NOSE-clear, THROAT-clear & wnl. NECK:  Supple w/ full ROM; no JVD; normal carotid impulses w/o bruits; no thyromegaly or nodules palpated; no lymphadenopathy. CHEST:  Clear to P & A; without wheezes/ rales/ or rhonchi. HEART:  Regular Rhythm; without murmurs/ rubs/ or gallops. ABDOMEN:  Soft & nontender; normal bowel sounds; no organomegaly or masses detected. RECTAL:  Neg - prostate 2+ rounded & nontender w/o nodules; stool hematest neg; atophic testes bilat... EXT: without deformities, mild arthritic changes; no varicose veins/ venous insuffic/ or edema. decr ROM left shoulder noted... NEURO:  CN's intact; motor testing normal; sensory testing normal; gait normal & balance OK. DERM:  mild intertrig rash noted...  RADIOLOGY DATA:  Reviewed in the EPIC EMR & discussed w/ the patient...  LABORATORY DATA:  Reviewed in the EPIC EMR & discussed w/ the patient...   Assessment & Plan:    Fall from ladder w/ left CWP>  No fx apparent & Rx w/ rest, heat/ice, rib binder, Vicodin prn.   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  HYPERLIPID>  On Lip40 & FLPs have looked good; needs better diet (low fat) & get weight down...  DM>  Stable on Metform+Glimep; Diet has not been optimal but he has lost some weight this yr; we reviewed DIET, EXERCISE, & get wt down!   OBESITY>  Diet & exercise are the keys...  GU>  Hypogonadism, ED>  Prev on Androgel (pt stopped on his own due to irritability & aggressive) & states that his energy is good, feels well etc...  Other medical problems as noted...   Patient's Medications  New Prescriptions   No medications on file  Previous Medications   ASPIRIN 81 MG TABLET    Take 81 mg by mouth daily.     HYDROCODONE-ACETAMINOPHEN (NORCO/VICODIN) 5-325 MG TABLET    Take 1 tablet by mouth every 6 (six) hours as needed for moderate pain.  Modified Medications   Modified Medication Previous Medication   ATENOLOL (TENORMIN) 50 MG TABLET atenolol (TENORMIN) 50 MG tablet      Take 1 tablet (50 mg total) by mouth daily.    Take 1 tablet by mouth  daily   ATORVASTATIN (LIPITOR) 80 MG TABLET atorvastatin (LIPITOR) 80 MG tablet      Take 0.5 tablets (40 mg total) by mouth daily.    Take 0.5 tablets (40 mg total) by mouth daily.   CLOPIDOGREL (PLAVIX) 75 MG TABLET clopidogrel (PLAVIX) 75 MG tablet      Take 1 tablet (75 mg total) by mouth daily.    Take 1 tablet by mouth  daily   GLIMEPIRIDE (AMARYL) 2 MG TABLET glimepiride (AMARYL) 2 MG tablet      Take 0.5 tablets (1 mg total) by mouth daily.    Take one-half tablet by  mouth daily   METFORMIN (GLUCOPHAGE) 500 MG TABLET metFORMIN (GLUCOPHAGE) 500 MG tablet      Take 2 tablets by mouth two times daily with meals    Take 2 tablets by mouth two times daily with meals  Discontinued Medications   TERBINAFINE (LAMISIL)  250 MG TABLET    Take 1 tablet (250 mg total) by mouth daily.

## 2016-06-26 NOTE — Patient Instructions (Signed)
Today we updated your med list in our EPIC system...    Continue your current medications the same...  We refilled your meds per request...  Today we reviewed your CXR from [id number], checked a f/u EKG, and reviewed your labs from [place]...    We ordered additional routine blood work that you can do at [facility]- have them send the results to me please.    We will contact you w/ the results when available...   Keep up the good work w/ your exercise program, but you need to do better when it comes to your diet!    Low carb (no sweets) and low fat; the bottom line has to be weight reduction...    At [address] [place] your top weight should be (BMI-25) -- 185 lbs  Call for any questions...   Let's plan a follow up visit in 38yr, sooner if needed for problems.Marland KitchenMarland Kitchen

## 2016-07-07 ENCOUNTER — Ambulatory Visit: Payer: 59 | Admitting: Gastroenterology

## 2016-07-17 ENCOUNTER — Telehealth: Payer: Self-pay

## 2016-07-17 ENCOUNTER — Encounter: Payer: Self-pay | Admitting: Gastroenterology

## 2016-07-17 ENCOUNTER — Ambulatory Visit (INDEPENDENT_AMBULATORY_CARE_PROVIDER_SITE_OTHER): Payer: 59 | Admitting: Gastroenterology

## 2016-07-17 VITALS — BP 110/64 | HR 80 | Ht 72.0 in | Wt 215.0 lb

## 2016-07-17 DIAGNOSIS — Z1211 Encounter for screening for malignant neoplasm of colon: Secondary | ICD-10-CM | POA: Diagnosis not present

## 2016-07-17 DIAGNOSIS — Z7902 Long term (current) use of antithrombotics/antiplatelets: Secondary | ICD-10-CM

## 2016-07-17 MED ORDER — NA SULFATE-K SULFATE-MG SULF 17.5-3.13-1.6 GM/177ML PO SOLN: 1.0000 | Freq: Once | ORAL | 0 refills | Status: AC

## 2016-07-17 NOTE — Progress Notes (Signed)
07/17/2016 Joe [name] [id number] 08/14/1951   HISTORY OF PRESENT ILLNESS:  This is a pleasant 65 year old male who is present at Dr. Sharlett Iles. He had a colonoscopy in March 2007 at which time the study was normal and repeat was recommended in 10 years. He is here today to discuss repeat colonoscopy. He does not have any GI complaints. He is on Plavix which he has been taking since 2001 for a stent that he had placed in his heart at that time. No other new or recent cardiac or major health issues. His Plavix is prescribed by his PCP, Dr. Lenna Gilford.  Past Medical History:  Diagnosis Date  . Backache, unspecified   . Coronary atherosclerosis of unspecified type of vessel, native or graft   . Other and unspecified hyperlipidemia   . Other chronic nonalcoholic liver disease   . Other testicular hypofunction   . Overweight(278.02)   . Type II or unspecified type diabetes mellitus without mention of complication, not stated as uncontrolled    Past Surgical History:  Procedure Laterality Date  . INGUINAL HERNIA REPAIR     right    reports that he has never smoked. He does not have any smokeless tobacco history on file. He reports that he does not drink alcohol or use drugs. family history includes Alcohol abuse in his mother; Diabetes in his father; Stroke in his mother. No Known Allergies    Outpatient Encounter Prescriptions as of 07/17/2016  Medication Sig  . aspirin 81 MG tablet Take 81 mg by mouth daily.    Marland Kitchen atenolol (TENORMIN) 50 MG tablet Take 1 tablet (50 mg total) by mouth daily.  Marland Kitchen atorvastatin (LIPITOR) 80 MG tablet Take 0.5 tablets (40 mg total) by mouth daily.  . clopidogrel (PLAVIX) 75 MG tablet Take 1 tablet (75 mg total) by mouth daily.  Marland Kitchen glimepiride (AMARYL) 2 MG tablet Take 0.5 tablets (1 mg total) by mouth daily.  . metFORMIN (GLUCOPHAGE) 500 MG tablet Take 2 tablets by mouth two times daily with meals  . [DISCONTINUED] HYDROcodone-acetaminophen  (NORCO/VICODIN) 5-325 MG tablet Take 1 tablet by mouth every 6 (six) hours as needed for moderate pain.   No facility-administered encounter medications on file as of 07/17/2016.      REVIEW OF SYSTEMS  : All other systems reviewed and negative except where noted in the History of Present Illness.   PHYSICAL EXAM: BP 110/64 (BP Location: Left Arm, Patient Position: Sitting, Cuff Size: Normal)   Pulse 80   Ht 6' (1.829 m)   Wt 215 lb (97.5 kg)   BMI 29.16 kg/m  General: Well developed white male in no acute distress Head: Normocephalic and atraumatic Eyes:  Sclerae anicteric, conjunctiva pink. Ears: Normal auditory acuity Lungs: Clear throughout to auscultation Heart: Regular rate and rhythm Abdomen: Soft, non-distended.  BS present.  Non-tender. Rectal:  Will be done at the time of colonoscopy. Musculoskeletal: Symmetrical with no gross deformities  Skin: No lesions on visible extremities Extremities: No edema  Neurological: Alert oriented x 4, grossly non-focal Psychological:  Alert and cooperative. Normal mood and affect  ASSESSMENT AND PLAN: -Screening colonoscopy:  Will schedule with Dr. Ardis Hughs. -Chronic antiplatelet use with Plavix for previous cardiac stent placed in 2001:  Hold Plavix for 5 days before procedure - will instruct when and how to resume after procedure. Risks and benefits of procedure including bleeding, perforation, infection, missed lesions, medication reactions and possible hospitalization or surgery if complications occur explained. Additional rare but real  risk of cardiovascular event such as heart attack or ischemia/infarct of other organs off Plavix explained and need to seek urgent help if this occurs. Will communicate by phone or EMR with patient's prescribing provider, Dr. Lenna Gilford, that to confirm holding Plavix is reasonable in this case.    CC:  Noralee Space, MD

## 2016-07-17 NOTE — Telephone Encounter (Signed)
  07/17/2016   RE: Joe [name] DOB: 03/20/1951 MRN: [id number]   Dear Dr Lenna Gilford,    We have scheduled the above patient for an endoscopic procedure. Our records show that he is on anticoagulation therapy.   Please advise as to how long the patient may come off his therapy of Plavix prior to the colonoscopy procedure, which is scheduled for 09/11/16.  Please fax back/ or route the completed form to [place], [place] at [phone].   Sincerely,    Alonza Bogus, PA-C

## 2016-07-17 NOTE — Patient Instructions (Addendum)
If you are age 63 or older, your body mass index should be between 23-30. Your Body mass index is 29.16 kg/m. If this is out of the aforementioned range listed, please consider follow up with your Primary Care Provider.  If you are age 14 or younger, your body mass index should be between 19-25. Your Body mass index is 29.16 kg/m. If this is out of the aformentioned range listed, please consider follow up with your Primary Care Provider.   We have sent the following medications to your pharmacy for you to pick up at your convenience: [place] have been scheduled for a colonoscopy. Please follow written instructions given to you at your visit today.  Please pick up your prep supplies at the pharmacy within the next 1-3 days. If you use inhalers (even only as needed), please bring them with you on the day of your procedure. Your physician has requested that you go to www.startemmi.com and enter the access code given to you at your visit today. This web site gives a general overview about your procedure. However, you should still follow specific instructions given to you by our office regarding your preparation for the procedure.  We will contact your cardiologist to confirm when you should discontinue your Plavix.

## 2016-07-18 NOTE — Progress Notes (Signed)
I agree with the above note, plan 

## 2016-07-25 ENCOUNTER — Encounter: Payer: Self-pay | Admitting: Pulmonary Disease

## 2016-08-28 ENCOUNTER — Encounter: Payer: Self-pay | Admitting: Gastroenterology

## 2016-09-11 ENCOUNTER — Ambulatory Visit (AMBULATORY_SURGERY_CENTER): Payer: 59 | Admitting: Gastroenterology

## 2016-09-11 ENCOUNTER — Encounter: Payer: Self-pay | Admitting: Gastroenterology

## 2016-09-11 VITALS — BP 118/68 | HR 16 | Temp 96.9°F | Resp 19 | Ht 72.0 in | Wt 215.0 lb

## 2016-09-11 DIAGNOSIS — Z1211 Encounter for screening for malignant neoplasm of colon: Secondary | ICD-10-CM | POA: Diagnosis not present

## 2016-09-11 DIAGNOSIS — D123 Benign neoplasm of transverse colon: Secondary | ICD-10-CM | POA: Diagnosis not present

## 2016-09-11 DIAGNOSIS — Z1212 Encounter for screening for malignant neoplasm of rectum: Secondary | ICD-10-CM

## 2016-09-11 DIAGNOSIS — D122 Benign neoplasm of ascending colon: Secondary | ICD-10-CM

## 2016-09-11 MED ORDER — SODIUM CHLORIDE 0.9 % IV SOLN: 500.0000 mL | INTRAVENOUS | Status: DC

## 2016-09-11 NOTE — Op Note (Signed)
[facility] Patient Name: Joe [name] Procedure Date: 09/11/2016 7:30 AM MRN: [id number] Endoscopist: Milus Banister , MD Age: 65 Referring MD:  Date of Birth: 1951-08-30 Gender: Male Account #: [id number] Procedure:                Colonoscopy Indications:              Screening for colorectal malignant neoplasm;                            colonoscopy 2007 Dr. Sharlett Iles was normal Medicines:                Monitored Anesthesia Care Procedure:                Pre-Anesthesia Assessment:                           - Prior to the procedure, a History and Physical                            was performed, and patient medications and                            allergies were reviewed. The patient's tolerance of                            previous anesthesia was also reviewed. The risks                            and benefits of the procedure and the sedation                            options and risks were discussed with the patient.                            All questions were answered, and informed consent                            was obtained. Prior Anticoagulants: The patient has                            taken Plavix (clopidogrel), last dose was 5 days                            prior to procedure. ASA Grade Assessment: III - A                            patient with severe systemic disease. After                            reviewing the risks and benefits, the patient was                            deemed in satisfactory condition to undergo the  procedure.                           After obtaining informed consent, the colonoscope                            was passed under direct vision. Throughout the                            procedure, the patient's blood pressure, pulse, and                            oxygen saturations were monitored continuously. The                            Model CF-HQ190L [id number]) scope was introduced                         through the anus and advanced to the the cecum,                            identified by appendiceal orifice and ileocecal                            valve. The colonoscopy was performed without                            difficulty. The patient tolerated the procedure                            well. The quality of the bowel preparation was                            excellent. The ileocecal valve, appendiceal                            orifice, and rectum were photographed. Scope In: 7:35:45 AM Scope Out: 7:48:33 AM Scope Withdrawal Time: 0 hours 9 minutes 28 seconds  Total Procedure Duration: 0 hours 12 minutes 48 seconds  Findings:                 Three sessile polyps were found in the transverse                            colon and ascending colon. The polyps were 3 to 4                            mm in size. These polyps were removed with a cold                            snare. Resection and retrieval were complete.                           The exam was otherwise without abnormality on  direct and retroflexion views. Complications:            No immediate complications. Estimated blood loss:                            None. Estimated Blood Loss:     Estimated blood loss: none. Impression:               - Three 3 to 4 mm polyps in the transverse colon                            and in the ascending colon, removed with a cold                            snare. Resected and retrieved.                           - The examination was otherwise normal on direct                            and retroflexion views. Recommendation:           - Patient has a contact number available for                            emergencies. The signs and symptoms of potential                            delayed complications were discussed with the                            patient. Return to normal activities tomorrow.                            Written  discharge instructions were provided to the                            patient.                           - Resume previous diet.                           - Continue present medications.                           You will receive a letter within 2-3 weeks with the                            pathology results and my final recommendations.                           If the polyp(s) is proven to be 'pre-cancerous' on                            pathology, you will need repeat colonoscopy in 3-5  years. If the polyp(s) is NOT 'precancerous' on                            pathology then you should repeat colon cancer                            screening in 10 years with colonoscopy without need                            for colon cancer screening by any method prior to                            then (including stool testing). Milus Banister, MD 09/11/2016 7:51:18 AM This report has been signed electronically.

## 2016-09-11 NOTE — Progress Notes (Signed)
Report given to PACU RN, vss 

## 2016-09-11 NOTE — Progress Notes (Signed)
Called to room to assist during endoscopic procedure.  Patient ID and intended procedure confirmed with present staff. Received instructions for my participation in the procedure from the performing physician.  

## 2016-09-11 NOTE — Patient Instructions (Signed)
YOU HAD AN ENDOSCOPIC PROCEDURE TODAY AT [facility]:   Refer to the procedure report that was given to you for any specific questions about what was found during the examination.  If the procedure report does not answer your questions, please call your gastroenterologist to clarify.  If you requested that your care partner not be given the details of your procedure findings, then the procedure report has been included in a sealed envelope for you to review at your convenience later.  YOU SHOULD EXPECT: Some feelings of bloating in the abdomen. Passage of more gas than usual.  Walking can help get rid of the air that was put into your GI tract during the procedure and reduce the bloating. If you had a lower endoscopy (such as a colonoscopy or flexible sigmoidoscopy) you may notice spotting of blood in your stool or on the toilet paper. If you underwent a bowel prep for your procedure, you may not have a normal bowel movement for a few days.  Please Note:  You might notice some irritation and congestion in your nose or some drainage.  This is from the oxygen used during your procedure.  There is no need for concern and it should clear up in a day or so.  SYMPTOMS TO REPORT IMMEDIATELY:   Following lower endoscopy (colonoscopy or flexible sigmoidoscopy):  Excessive amounts of blood in the stool  Significant tenderness or worsening of abdominal pains  Swelling of the abdomen that is new, acute  Fever of 100F or higher  For urgent or emergent issues, a gastroenterologist can be reached at any hour by calling [phone].  DIET:  We do recommend a small meal at first, but then you may proceed to your regular diet.  Drink plenty of fluids but you should avoid alcoholic beverages for 24 hours.  ACTIVITY:  You should plan to take it easy for the rest of today and you should NOT DRIVE or use heavy machinery until tomorrow (because of the sedation medicines used during the test).     FOLLOW UP: Our staff will call the number listed on your records the next business day following your procedure to check on you and address any questions or concerns that you may have regarding the information given to you following your procedure. If we do not reach you, we will leave a message.  However, if you are feeling well and you are not experiencing any problems, there is no need to return our call.  We will assume that you have returned to your regular daily activities without incident.  If any biopsies were taken you will be contacted by phone or by letter within the next 1-3 weeks.  Please call us at [phone] if you have not heard about the biopsies in 3 weeks.   SIGNATURES/CONFIDENTIALITY: You and/or your care partner have signed paperwork which will be entered into your electronic medical record.  These signatures attest to the fact that that the information above on your After Visit Summary has been reviewed and is understood.  Full responsibility of the confidentiality of this discharge information lies with you and/or your care-partner.  Resume Plavix tomorrow  Please read over handout at polyps

## 2016-09-12 ENCOUNTER — Telehealth: Payer: Self-pay

## 2016-09-12 NOTE — Telephone Encounter (Signed)
  Follow up Call-  Call back number 09/11/2016  Post procedure Call Back phone  # [phone]  Permission to leave phone message Yes  Some recent data might be hidden    Patient was called for follow up after his procedure on 09/11/2016. I spoke to the patients wife and she reports that Joe [name] has returned to his normal daily activities without any complications following his procedure.

## 2016-09-12 NOTE — Telephone Encounter (Signed)
  Follow up Call-  Call back number 09/11/2016  Post procedure Call Back phone  # [phone]  Permission to leave phone message Yes  Some recent data might be hidden    Patient was called for follow up after his procedure on 09/11/2016.No answer at the number given for follow up phone call. A message was left on the answering machine.

## 2016-09-20 ENCOUNTER — Encounter: Payer: Self-pay | Admitting: Gastroenterology

## 2017-06-04 LAB — HM DIABETES EYE EXAM

## 2017-06-27 ENCOUNTER — Encounter: Payer: Self-pay | Admitting: Pulmonary Disease

## 2017-06-27 ENCOUNTER — Ambulatory Visit (INDEPENDENT_AMBULATORY_CARE_PROVIDER_SITE_OTHER): Payer: 59 | Admitting: Pulmonary Disease

## 2017-06-27 ENCOUNTER — Ambulatory Visit (INDEPENDENT_AMBULATORY_CARE_PROVIDER_SITE_OTHER)
Admission: RE | Admit: 2017-06-27 | Discharge: 2017-06-27 | Disposition: A | Discharge status: Home / Self Care | Payer: 59 | Source: Ambulatory Visit | Attending: Pulmonary Disease | Admitting: Pulmonary Disease

## 2017-06-27 VITALS — BP 104/62 | HR 54 | Temp 97.5°F | Ht 72.0 in | Wt 213.0 lb

## 2017-06-27 DIAGNOSIS — E782 Mixed hyperlipidemia: Secondary | ICD-10-CM

## 2017-06-27 DIAGNOSIS — E119 Type 2 diabetes mellitus without complications: Secondary | ICD-10-CM

## 2017-06-27 DIAGNOSIS — R7989 Other specified abnormal findings of blood chemistry: Secondary | ICD-10-CM

## 2017-06-27 DIAGNOSIS — Z0000 Encounter for general adult medical examination without abnormal findings: Secondary | ICD-10-CM | POA: Diagnosis not present

## 2017-06-27 DIAGNOSIS — I2510 Atherosclerotic heart disease of native coronary artery without angina pectoris: Secondary | ICD-10-CM

## 2017-06-27 DIAGNOSIS — G8929 Other chronic pain: Secondary | ICD-10-CM

## 2017-06-27 DIAGNOSIS — M545 Low back pain: Secondary | ICD-10-CM

## 2017-06-27 DIAGNOSIS — K760 Fatty (change of) liver, not elsewhere classified: Secondary | ICD-10-CM

## 2017-06-27 DIAGNOSIS — E663 Overweight: Secondary | ICD-10-CM

## 2017-06-27 MED ORDER — ATORVASTATIN CALCIUM 80 MG PO TABS: 40.0000 mg | ORAL_TABLET | Freq: Every day | ORAL | 3 refills | Status: DC

## 2017-06-27 MED ORDER — GLIMEPIRIDE 2 MG PO TABS: 1.0000 mg | ORAL_TABLET | Freq: Every day | ORAL | 3 refills | Status: DC

## 2017-06-27 MED ORDER — CLOPIDOGREL BISULFATE 75 MG PO TABS: 75.0000 mg | ORAL_TABLET | Freq: Every day | ORAL | 3 refills | Status: DC

## 2017-06-27 MED ORDER — METFORMIN HCL 500 MG PO TABS: ORAL_TABLET | ORAL | 3 refills | Status: DC

## 2017-06-27 MED ORDER — ATENOLOL 50 MG PO TABS: 50.0000 mg | ORAL_TABLET | Freq: Every day | ORAL | 3 refills | Status: DC

## 2017-06-27 NOTE — Patient Instructions (Signed)
Today we updated your med list in our EPIC system...    Continue your current medications the same...  Today we did your follow up CXR & EKG... Please have your FASTING blood work done at [facility] (labs written on Rx)...    We will contact you w/ the results when available...   Keep up the good work w/ diet & exercise...  Kudos to [place] as well...  Call for any questions...  Let's plan a follow up visit in 52yr, sooner if needed for problems.Marland KitchenMarland Kitchen

## 2017-06-27 NOTE — Progress Notes (Signed)
Subjective:    Patient ID: Joe [name], male    DOB: [birth year], 66 y.o.   MRN: [id number]  HPI 66 y/o WM here for a follow up visit... he has multiple medical problems as noted below...  ~  SEE PREV EPIC NOTES FOR OLDER DATA >>    EKG 8/13 showed NSR, rate64, inferior scar, rsr' in V1...  LABS 8/13:  FLP- chol ok but TG=168;  Chems- ok w/ BS=140 A1c=6.3;  TSH=1.74;  PSA= 0.3   ~  June 18, 2013:  Yearly ROV & CPX>  Overall Joe [name] is feeling well- still working at the [employer] at [employer], gets plenty of exercise w/o difficulty...    CAD> on ASA81, Plavix75, Aten50; BP= 130/70 & he denies CP, palpit, SOB, edema, etc; exercise= walking a lot at work & asymptomatic...    Hyperlipid> on Lip80-1/2 tab daily; FLP 7/14 at work showed TChol 115, TG 85, HDL 38, LDL 60    DM> on Metform500-2Bid, Glimep2mg -1/2 daily; Labs at work 7/14 showed BS= 129, A1c= 6.4    Overweight> he has lost 7# down to 212# today; he gets exercise parking cars at [employer], but needs better diet!    Hypogonadism> hx bilat testic atrophy & Testos levels ~170; prev on Androgel 1.62% but he stopped it on his own due to change in behavior- more aggressive & irritable, didn't like the effects; states his energy is good, strength normal, feeling well...    DJD, right wrist pain> c/o right wrist pain & rec to do hot soaks, wear brace & use OTC Advil/ Aleve prn; we can refer to Ortho when needed... We reviewed prob list, meds, xrays and labs> see below for updates >>   EKG 9/14 showed NSR, rate62, infer scar, RBBB, NAD...   LABS 9/14:  Pending... (he never got these labs done)  ~  June 18, 2014:  Yearly ROV & CPX> Joe [name] reports a good yr- no new complaints or concerns... He continues to work as a Engineer, water at the [employer] gets plenty of exercise doing this... We reviewed the following medical problems during today's office visit >>     CAD> on ASA81, Plavix75, Aten50; BP=  110/62 & he denies CP, palpit, SOB, edema, etc; exercise= walking a lot at work & asymptomatic...    Hyperlipid> on Lip80-1/2 tab daily; FLP 8/15 at work showed TChol 115, TG 167, HDL 32, LDL 50 and we reviewed low fat diet & exercise program...    DM> on Metform500-2Bid, Glimep2mg -1/2 daily; Labs at work 8/15 showed BS= 81, A1c= 6.1 & I told him if he lost more weight we could decr his DM meds...    Overweight> he has lost 5# more down to 207# today; he gets exercise parking cars at [facility], but needs better diet!    Hypogonadism> hx bilat testic atrophy & Testos levels ~170; prev on Androgel 1.62% but he stopped it on his own due to change in behavior- more aggressive & irritable, didn't like the effects; states his energy is good, strength normal, feeling well...    DJD, right wrist pain> he had hx right wrist pain & rec to do hot soaks, wear brace & use OTC Advil/ Aleve prn=> resolved & gets about well...  We reviewed prob list, meds, xrays and labs> see below for updates >> he will get 2015 Flu vaccine at [facility]...  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 We reviewed the following medical problems during today's office visit >>     CAD> on ASA81, Plavix75, Aten50; BP= 104/60 & he denies CP, palpit, SOB, edema, etc; exercise= walking a lot at work & asymptomatic...    Hyperlipid> on Lip80-1/2 tab daily; FLP 9/16 at work showed TChol 114, TG 176, HDL 32, LDL 46 and we reviewed low fat diet & exercise program...    DM> on Metform500-2Bid, Glimep2mg -1/2 daily; Labs at work 9/16 showed BS= 108, A1c= 6.4 & I  told him if he lost more weight we could decr his DM meds...    Overweight> wt is stable at 207# today; he gets exercise parking cars at [facility], but needs better diet!    Hypogonadism> hx bilat testic atrophy & Testos levels ~170; prev on Androgel 1.62% but he stopped it on his own due to change in behavior- more aggressive & irritable, didn't like the effects; states his energy is good, strength normal, feeling well...    DJD, right wrist pain> he had hx right wrist pain & rec to do hot soaks, wear brace & use OTC Advil/ Aleve prn=> resolved & gets about well...  We reviewed prob list, meds, xrays and labs> see below for updates >> he was advised he needs CBC, TSH, PSA but he declines additional labs  CXR 06/24/15 showed norm heart size, mild bibasilar pleuroparenchymal scarring, no change and NAD...   LABS 06/2015 from [facility] at work (he refused full labs here in our office)> FLP- TChol 114, TG 176, HDL 32, LDL 46;  BS=108, A1c=6.4;  Cr=0.85   ~  Feb 22, 2016:  36mo ROV & add-on appt requested by pt after a fall from a ladder ~1wk ago;  He was cleaning his gutters and the ladder slipped, he grabbed the gutter which slowed his fall, landed on left side w/ c/o sore left post ribs, left arm, bilat hands, & leg bruised; he has good ROM, breathing is Ok w/o dyspnea but sore/ tender on left chest; Note- he did not go to the ER for eval...  EXAM shows Afeb, VSS, O2sat=100% on RA;  HEENT- neg, mallamapti2;  Chest- tender left chest wall post-lat, clear w/o w/r/r;  Heart- RR w/o m/r/g;  Abd- soft, nontender, neg;  Ext- bruises, w/o c/c/e;  Neuro- intact...  CXR & left rib details 02/22/16>  No fx seen, norm heart size, min scarring left lat lung base, sl prom interstitial markings but clear- NAD... IMP/PLAN>>  Fall from ladder w/ left chest wall trauma=> rest, heat/ice, rib binder, Vicodin prn...  ~  June 26, 2016:  2mo ROV & time for Don's annual CPX>  Joe [name] reports a good year overall,  just the fall from the ladder in May, now fully recovered- no new complaints or concerns, he is still parking cars at [employer] he says this is a lot of daily exercise!Marland Kitchen.. we reviewed the following medical problems during today's office visit >>     CAD> on ASA81, Plavix75, Aten50; BP= 124/70 & he denies CP, palpit, SOB, edema, etc; exercise=  walking a lot at work & asymptomatic...    Hyperlipid> on Lip80-1/2 tab daily; FLP 8/17 at work showed TChol 135, TG 256, HDL 32, LDL 52 and we reviewed low fat diet & exercise program...    DM> on Metform500-2Bid, Glimep2mg -1/2 daily; Labs at work 8/17 showed BS= 109, A1c= 7.4 & I told him that if he doesn't lose the wt we'll have to add meds!    Overweight> wt is up to 217# today; he gets exercise parking cars at [facility], but needs better diet!    He had a normal colonoscopy 12/2005 by DrPatterson & due for f/u study, we will refer...    Hypogonadism> hx bilat testic atrophy & Testos levels ~170; prev on Androgel 1.62% but he stopped it on his own due to change in behavior- more aggressive & irritable, didn't like the effects; states his energy is good, strength normal, feeling well...    DJD, right wrist pain> he had hx right wrist pain & rec to do hot soaks, wear brace & use OTC Advil/ Aleve prn=> resolved & gets about well...  EXAM shows Afeb, VSS, O2sat=99% on RA;  HEENT- neg, mallamapti2;  Chest- tender left chest wall post-lat, clear w/o w/r/r;  Heart- RR w/o m/r/g;  Abd- soft, nontender, neg;  Ext- bruises, w/o c/c/e;  Neuro- intact...  LABS done at [place] 06/09/16>  [facility];  BS=109, A1c=7.4;  Cr=0.85...  LABS done at [facility] 06/26/16>  Chems/LFTs- wnl;  CBC- wnl;  TSH=1.46;  PSA=0.4  EKG 06/26/16>  SBrady, rate52, RBBB, inferior Qs, NAD...  IMP/PLAN>>  Joe [name] is doing well, we will refer him to GI for colonoscopy due; we reviewed better low carb, low fat diet & he understands that this must result in wt reducton!  Same meds  for now, call for questions or problems...    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 He had his 2018 Flu vaccine at [facility]... We reviewed interval Epic records >>     He saw GI- DrJacobs 08/2016 for colonoscopy due>  3 tiny polyps removed (3-3mm size), Path showed 3 tubular adenomas & follow up is again planned in 70yrs per protocol... We reviewed the following medical problems during today's office visit >>     CAD, RBBB on EKG> Hx IWMI 2001 w/ PTCA & stent in RCA, plus some 20-30% dis in LAD noted; on ASA81, Plavix75, Aten50; hasn't seen CARDS in >37yrs; BP= 110/600 & he denies CP, palpit, SOB, edema, etc; exercise= walking a lot at work & he remains asymptomatic...    Hyperlipid> on Lip80-1/2 tab daily; FLP 9/18 showed TChol 132, TG 163, HDL 35, LDL 64; this is improved & we reviewed low fat diet & exercise program...    DM> on Metform500-2Bid, Glimep2mg -1/2 daily; Labs 9/18 showed BS= 150 & A1c wasn't done as requested; last A1c= 7.4 (8/17) & I told him that if he doesn't lose the wt we'll have to add meds!    Overweight> wt is stable around 213# today; BMI is ~29; he gets exercise parking cars at [facility], but needs better diet!    Colon polyps> he had a normal colonoscopy 12/2005 by DrPatterson & repeat 11/17 by DrJacobs showed 3 tiny tubular adenomas, otherw neg & f/u planned 75yrs  per protocol...    Hypogonadism> hx bilat testic atrophy & Testos levels ~170; prev on Androgel 1.62% but he stopped it on his own due to change in behavior- more aggressive & irritable, didn't like the effects; states his energy is good, strength normal, feeling well...    DJD, right wrist pain> he had hx right wrist pain & rec to do hot soaks, wear brace & use OTC Advil/ Aleve prn=> resolved &  gets about well...  EXAM shows Afeb, VSS, O2sat=99% on RA;  HEENT- neg, mallamapti2;  Chest- clear w/o w/r/r;  Heart- RR w/o m/r/g;  Abd- soft, nontender, neg;  Ext- neg, w/o c/c/e;  Neuro- intact...  CXR 06/27/17 (independently reviewed by me in the PACS system) showed norm heart size, sl coarsened interstitial markings but otherw clear lungs- NAD.Marland KitchenMarland Kitchen  EKG 06/27/17 showed SBrady, rate53, RBBB, sm infer Qs, otherw neg EKG & NAD...   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            Problem List:  CAD (ICD-414.00) - presented w/ CP in 2001 w/ IWMI and cath ([place]- DrParaschos) showing a 90% RCA lesion- s/p PTCA/ stent... he also had some mild LAD dis 20-30% at that time... seen 2005 by DrCrenshaw... he is on ASA 81mg /d, PLAVIX 75mg /d, & ATENOLOL 50mg /d... ~  NuclearStressTest 7/05 showed inferolat infarct, no ischemia, impaired exerc capacity, EF=60%.  ~  8/10:  Baseline EKG showed NSR, sl IVCD, inferior scar, NAD; doing well- denies CP, palpit, SOB... walking daily w/o problems... ~  8/11: he remains asymptomatic but I have encouraged Cards f/u appt (he never did). ~  2/12:  He saw TP w/ Bronchitis> CXR was clear, wnl... ~  8/12:  EKG showed NSR, RBBB ~  EKG 8/13 showed NSR, rate64, inferior scar, rsr' in V1... ~  9/14: on ASA81, Plavix75, Aten50; BP= 130/70 & he denies CP, palpit, SOB, edema, etc; exercise= walking a lot at work & asymptomatic; EKG showed NSR, rate62,  infer scar, RBBB, NAD...  ~  9/15: on ASA81, Plavix75, Aten50; BP= 110/62 & he denies CP, palpit, SOB, edema, etc; exercise= walking a lot at work & asymptomatic. ~  9/16: on ASA81, Plavix75, Aten50; BP= 104/60 & he denies CP, palpit, SOB, edema, etc; exercise= walking a lot at work & asymptomatic.  HYPERLIPIDEMIA (ICD-272.4) - on LIPITOR 80mg - taking 1/2 tab daily... Labs all done at [facility]> ~  FLP 6/08 showed TChol 142, TG 207, HDL 32, LDL 69... told to work on diet & weight... ~  [place] 6/09 at [facility] showed TChol 140, TG 144, HDL 32, LDL 79... improved, continue Lipitor + diet. ~  FLP 8/10 at [facility] showed TChol 145, TG 167, HDL 33, LDL 79 ~  FLP 8/11 at [facility] on Lip40 showed TChol 136, TG 125, HDL 40, LDL 71... Continue same. ~  FLP 8/12 at [facility] on Lip40 showed TChol 127, TG 170, HDL 32, LDL 61 ~  FLP 8/13 at [facility] on [address] showed TChol 143, TG 168, HDL 35, LDL 74 ~  9/14: on Lip80-1/2 tab daily; FLP 7/14 at work showed TChol 115, TG 85, HDL 38, LDL 60 ~  9/15: on Lip80-1/2 tab daily; FLP 8/15 at work showed TChol 115, TG 167, HDL 32, LDL 50 and we reviewed low fat diet & exercise program... ~  9/16: on Lip80-1/2 tab daily; FLP 9/16 at work showed TChol 114, TG 176, HDL 32, LDL 46 and we reviewed low fat diet & exercise program.  DIABETES MELLITUS (ICD-250.00) - on METFORMIN 500mg -2Bid, and GLIMEPIRIDE 4mg - 1/2 qd; needs better diet & get weight down. ~  labs 6/08 showed BS= 161, HgA1c= not done by [facility]... On Metform500Bid. ~  labs 6/09 showed BS= 90, HgA1c= 6.0.Marland Kitchen. stay on diet, and decr the Glimepiride to 1/2 tab Qam... ~  8/10:  states BS at home are "OK"; labs at [facility] showed BS= 94, A1c not done. ~  8/11:  states BS are OK when he checks them, [facility] labs showed BS= 149, A1c= 8.0 ~  8/12:  states BS remains OK at home, but Labs showed BS=220, A1c=9.0.Marland KitchenMarland Kitchen meds increased, get on diet, etc... ~  6/13:  Eye exam by DrNice in [place]> no diabetic retinopathy... ~  8/13:  Labs  at [facility] on Metform500-2Bid+Glimep2 showed BS=140, A1c=6.3 ~  9/14:  on Metform500Bid, Glimep2mg -1/2 daily; Labs at work 7/14 showed BS= 129, A1c= 6.4  ~  9/15: on Metform500-2Bid, Glimep2mg -1/2 daily; Labs at work 8/15 showed BS= 81, A1c= 6.1 & I told him if he lost more weight we could decr his DM meds. ~  9/16: on Metform500-2Bid, Glimep2mg -1/2 daily; Labs at work 9/16 showed BS= 108, A1c= 6.4; continue same, get wt down.  OVERWEIGHT (ICD-278.02) - we reviewed diet+exercise needed for weight reduction. ~  weight in the 2000 decade varied betw 225-250.Marland Kitchen. ~  weight 6/09 = 231# ~  weight 8/10 = 237# ~  weight 8/11 = 229# ~  Weight 8/12 = 231# ~  Weight 8/13 = 218#... Keep up the good work. ~  Weight 9/14 = 212# ~  Weight 9/15 = 207# ~  Weight 9/16 = 207#  COLONOSCOPY - He had a normal colonoscopy 3/07 by DrPatterson...  FATTY LIVER DISEASE (ICD-571.8) - LFT's sl elevated in the past...  ~  LFT's on labs since 2008 have all been WNL...  HYPOGONADISM, MALE (ICD-257.2) - he has atrophic testes and low testosterone levels; on Rx w/ ANDROGEL 5gm/d rubbed into skin daily... ~  Labs 2007 showed Testosterone level = 177 (350-900) ~  Labs at [facility] 8/10 showed Testosterone = 171 (280-800) & rec to use the Angdrogel daily; PSA= 0.4 ~  labs at [facility] 8/11 showed Testosterone level = 125, PSA= 0.5 ~  Labs from [facility] 8/12 showed PSA= 0.4 (Testos not done)... ~  Labs from [facility] 8/13 showed PSA= 0.3 and pt indicates that he's stopped Androgel on his own... ~  9/14:  hx bilat testic atrophy & Testos levels ~170; prev on Androgel 1.62% but he stopped it on his own due to change in behavior- more aggressive & irritable, didn't like the effects; states his energy is good, strength normal, feeling well.  Health Maintenance >> he is a never smoker; quit Etoh in the 70's... ~  GI:  He had neg colon 2007, f/u due 10 yrs... ~  GU:  Atrophic testes> prev on Androgel but pt stopped on his own; has ED>  not currently on  RX. ~  Immuniz:  s/p Tetanus shot in 6/08; s/p Pneumovax 6/09; he does not want the Flu vaccine;   Past Surgical History:  Procedure Laterality Date  . angiopolasty/stent  04/2000   one stent, inferior artery   . INGUINAL HERNIA REPAIR Bilateral     Outpatient Encounter Prescriptions as of 06/27/2017  Medication Sig  . aspirin 81 MG tablet Take 81 mg by mouth daily.    Marland Kitchen atenolol (TENORMIN) 50 MG tablet Take 1 tablet (50 mg total) by mouth daily.  Marland Kitchen atorvastatin (LIPITOR) 80 MG tablet Take 0.5 tablets (40 mg total) by mouth daily.  . clopidogrel (PLAVIX) 75 MG tablet Take 1 tablet (75 mg total) by mouth daily.  Marland Kitchen glimepiride (AMARYL) 2 MG tablet Take 0.5 tablets (1 mg total) by mouth daily.  . metFORMIN (GLUCOPHAGE) 500 MG tablet Take 2 tablets by mouth two times daily with meals  . [DISCONTINUED] atenolol (TENORMIN) 50 MG tablet Take 1 tablet (50 mg total) by mouth daily.  . [DISCONTINUED] atorvastatin (LIPITOR) 80 MG tablet Take 0.5 tablets (40 mg total) by mouth daily.  . [DISCONTINUED] clopidogrel (PLAVIX) 75 MG tablet Take 1 tablet (75 mg total) by mouth daily.  . [DISCONTINUED] glimepiride (AMARYL) 2 MG tablet Take 0.5 tablets (1 mg total) by mouth daily.  . [DISCONTINUED] metFORMIN (GLUCOPHAGE) 500 MG tablet Take 2 tablets by mouth two times daily with meals   Facility-Administered Encounter Medications as of 06/27/2017  Medication  . 0.9 %  sodium chloride infusion    No Known Allergies    Immunization History  Administered Date(s) Administered  . Influenza Split 07/17/2011, 07/18/2012  . Influenza Whole 07/28/2008, 08/05/2009, 07/16/2010  . Influenza,inj,Quad PF,6+ Mos 06/25/2017  . Influenza-Unspecified 07/17/2015  . Pneumococcal Polysaccharide-23 04/09/2008  . Tdap 08/17/2011    Current Medications, Allergies, Past Medical History, Past Surgical History, Family History, and Social History were reviewed in [employer] record.     Review of Systems        The patient complains of joint pain and stiffness.  The patient denies fever, chills, sweats, anorexia, fatigue, weakness, malaise, weight loss, sleep disorder, blurring, diplopia, eye irritation, eye discharge, vision loss, eye pain, photophobia, earache, ear discharge, tinnitus, decreased hearing, nasal congestion, nosebleeds, sore throat, hoarseness, chest pain, palpitations, syncope, dyspnea on exertion, orthopnea, PND, peripheral edema, cough, dyspnea at rest, excessive sputum, hemoptysis, wheezing, pleurisy, nausea, vomiting, diarrhea, constipation, change in bowel habits, abdominal pain, melena, hematochezia, jaundice, gas/bloating, indigestion/heartburn, dysphagia, odynophagia, dysuria, hematuria, urinary frequency, urinary hesitancy, nocturia, incontinence, back pain, joint swelling, muscle cramps, muscle weakness, arthritis, sciatica, restless legs, leg pain at night, leg pain with exertion, rash, itching, dryness, suspicious lesions, paralysis, paresthesias, seizures, tremors, vertigo, transient blindness, frequent falls, frequent headaches, difficulty walking, depression, anxiety, memory loss, confusion, cold intolerance, heat intolerance, polydipsia, polyphagia, polyuria, unusual weight change, abnormal bruising, bleeding, enlarged lymph nodes, urticaria, allergic rash, hay fever, and recurrent infections.     Objective:   Physical Exam      WD, Overweight, 66 y/o WM in NAD... GENERAL:  Alert & oriented; pleasant & cooperative... HEENT:  [place]/AT, EOM-wnl, PERRLA, EACs-clear, TMs-wnl, NOSE-clear, THROAT-clear & wnl. NECK:  Supple w/ full ROM; no JVD; normal carotid impulses w/o bruits; no thyromegaly or nodules palpated; no lymphadenopathy. CHEST:  Clear to P & A; without wheezes/ rales/ or rhonchi. HEART:  Regular Rhythm; without murmurs/ rubs/ or gallops. ABDOMEN:  Soft & nontender; normal bowel sounds; no organomegaly or masses detected. RECTAL:  Neg -  prostate 2+ rounded & nontender w/o nodules; stool hematest neg; atophic testes bilat... EXT: without deformities, mild arthritic changes; no varicose veins/ venous insuffic/ or edema. decr ROM left shoulder noted... NEURO:  CN's intact; motor testing normal; sensory testing normal; gait normal & balance OK. DERM:  mild intertrig rash noted...  RADIOLOGY DATA:  Reviewed in the EPIC EMR & discussed w/ the patient...  LABORATORY DATA:  Reviewed in the EPIC EMR & discussed w/ the patient...   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  HYPERLIPID>  On Lip40 & FLPs have looked good; needs better diet (low fat) & get weight down...  DM>  Stable on Metform+Glimep; Diet has not been optimal but he has lost some weight this yr; we reviewed DIET, EXERCISE, & get wt down!   OBESITY>  Diet & exercise are the keys...  GU>  Hypogonadism, ED>  Prev on Androgel (pt stopped on his own due to irritability & aggressive) & states that his energy is good, feels well etc...  Fall from ladder 5/17 w/ left CWP>  No fx apparent & Rx w/ rest, heat/ice, rib binder, Vicodin prn.  Other medical problems as noted...   Patient's Medications  New Prescriptions   No medications on file  Previous Medications   ASPIRIN 81 MG TABLET    Take 81 mg by mouth daily.    Modified Medications   Modified Medication Previous Medication   ATENOLOL (TENORMIN) 50 MG  TABLET atenolol (TENORMIN) 50 MG tablet      Take 1 tablet (50 mg total) by mouth daily.    Take 1 tablet (50 mg total) by mouth daily.   ATORVASTATIN (LIPITOR) 80 MG TABLET atorvastatin (LIPITOR) 80 MG tablet      Take 0.5 tablets (40 mg total) by mouth daily.    Take 0.5 tablets (40 mg total) by mouth daily.   CLOPIDOGREL (PLAVIX) 75 MG TABLET clopidogrel (PLAVIX) 75 MG tablet      Take 1 tablet (75 mg total) by mouth daily.    Take 1 tablet (75 mg total) by mouth daily.   GLIMEPIRIDE (AMARYL) 2 MG TABLET glimepiride (AMARYL) 2 MG tablet      Take 0.5 tablets (1 mg total) by mouth daily.    Take 0.5 tablets (1 mg total) by mouth daily.   METFORMIN (GLUCOPHAGE) 500 MG TABLET metFORMIN (GLUCOPHAGE) 500 MG tablet      Take 2 tablets by mouth two times daily with meals    Take 2 tablets by mouth two times daily with meals  Discontinued Medications   No medications on file

## 2017-07-10 ENCOUNTER — Other Ambulatory Visit: Payer: Self-pay | Admitting: Pulmonary Disease

## 2017-07-11 LAB — PSA: PROSTATE SPECIFIC AG, SERUM: 0.4 ng/mL (ref 0.0–4.0)

## 2017-07-11 LAB — CBC WITH DIFFERENTIAL/PLATELET
BASOS ABS: 0 10*3/uL (ref 0.0–0.2)
Basos: 1 %
EOS (ABSOLUTE): 0.3 10*3/uL (ref 0.0–0.4)
Eos: 5 %
HEMATOCRIT: 41.8 % (ref 37.5–51.0)
Hemoglobin: 14.5 g/dL (ref 13.0–17.7)
IMMATURE GRANS (ABS): 0 10*3/uL (ref 0.0–0.1)
Immature Granulocytes: 0 %
LYMPHS: 25 %
Lymphocytes Absolute: 1.6 10*3/uL (ref 0.7–3.1)
MCH: 28.9 pg (ref 26.6–33.0)
MCHC: 34.7 g/dL (ref 31.5–35.7)
MCV: 83 fL (ref 79–97)
MONOCYTES: 7 %
Monocytes Absolute: 0.5 10*3/uL (ref 0.1–0.9)
NEUTROS ABS: 3.8 10*3/uL (ref 1.4–7.0)
Neutrophils: 62 %
Platelets: 188 10*3/uL (ref 150–379)
RBC: 5.02 x10E6/uL (ref 4.14–5.80)
RDW: 13.4 % (ref 12.3–15.4)
WBC: 6.1 10*3/uL (ref 3.4–10.8)

## 2017-07-11 LAB — COMPREHENSIVE METABOLIC PANEL
A/G RATIO: 1.8 (ref 1.2–2.2)
ALBUMIN: 4.7 g/dL (ref 3.6–4.8)
ALT: 34 IU/L (ref 0–44)
AST: 31 IU/L (ref 0–40)
Alkaline Phosphatase: 63 IU/L (ref 39–117)
BILIRUBIN TOTAL: 0.7 mg/dL (ref 0.0–1.2)
BUN/Creatinine Ratio: 17 (ref 10–24)
BUN: 14 mg/dL (ref 8–27)
CALCIUM: 9.6 mg/dL (ref 8.6–10.2)
CO2: 27 mmol/L (ref 20–29)
Chloride: 101 mmol/L (ref 96–106)
Creatinine, Ser: 0.81 mg/dL (ref 0.76–1.27)
GFR calc non Af Amer: 94 mL/min/{1.73_m2} (ref >59)
GFR, EST AFRICAN AMERICAN: 109 mL/min/{1.73_m2} (ref >59)
GLOBULIN, TOTAL: 2.6 g/dL (ref 1.5–4.5)
Glucose: 150 mg/dL — ABNORMAL HIGH (ref 65–99)
POTASSIUM: 4.8 mmol/L (ref 3.5–5.2)
SODIUM: 145 mmol/L — AB (ref 134–144)
TOTAL PROTEIN: 7.3 g/dL (ref 6.0–8.5)

## 2017-07-11 LAB — LIPID PANEL W/O CHOL/HDL RATIO
Cholesterol, Total: 132 mg/dL (ref 100–199)
HDL: 35 mg/dL — ABNORMAL LOW (ref >39)
LDL CALC: 64 mg/dL (ref 0–99)
Triglycerides: 163 mg/dL — ABNORMAL HIGH (ref 0–149)
VLDL CHOLESTEROL CAL: 33 mg/dL (ref 5–40)

## 2017-07-11 LAB — TSH: TSH: 1.06 u[IU]/mL (ref 0.450–4.500)

## 2018-06-13 ENCOUNTER — Other Ambulatory Visit: Payer: Self-pay | Admitting: Pulmonary Disease

## 2018-07-01 ENCOUNTER — Ambulatory Visit: Payer: 59 | Admitting: Pulmonary Disease

## 2018-09-06 ENCOUNTER — Other Ambulatory Visit: Payer: Self-pay | Admitting: Pulmonary Disease

## 2018-09-22 ENCOUNTER — Other Ambulatory Visit: Payer: Self-pay | Admitting: Pulmonary Disease

## 2019-09-15 ENCOUNTER — Encounter: Payer: Self-pay | Admitting: Gastroenterology

## 2019-09-19 ENCOUNTER — Other Ambulatory Visit: Payer: Self-pay

## 2019-09-19 ENCOUNTER — Ambulatory Visit (INDEPENDENT_AMBULATORY_CARE_PROVIDER_SITE_OTHER): Payer: 59 | Admitting: Gastroenterology

## 2019-09-19 ENCOUNTER — Telehealth: Payer: Self-pay

## 2019-09-19 ENCOUNTER — Encounter: Payer: Self-pay | Admitting: Gastroenterology

## 2019-09-19 VITALS — BP 110/70 | HR 64 | Temp 97.9°F | Ht 70.75 in | Wt 211.0 lb

## 2019-09-19 DIAGNOSIS — Z86010 Personal history of colonic polyps: Secondary | ICD-10-CM

## 2019-09-19 DIAGNOSIS — Z7901 Long term (current) use of anticoagulants: Secondary | ICD-10-CM

## 2019-09-19 MED ORDER — NA SULFATE-K SULFATE-MG SULF 17.5-3.13-1.6 GM/177ML PO SOLN: 1.0000 | Freq: Once | ORAL | 0 refills | Status: AC

## 2019-09-19 NOTE — Progress Notes (Signed)
Review of pertinent gastrointestinal problems: 1.  History of adenomatous colon polyps.  Colonoscopy Dr. Verl Blalock 2007 found no polyps.  Colonoscopy November 2017 Dr. Ardis Hughs found and removed 3 subcentimeter polyps.  These were all adenomas.   HPI: This is a very pleasant 68 year old [name] whom I last saw at the time of a colonoscopy little over 3 years ago.  See those results summarized above  Chief complaint is history of precancerous colon polyps  He has not had any troubles with his bowels since the last colonoscopy.  He does have a BM every day or every other day.  No overt bleeding.  His weight has been overall stable.  No significant constipation or diarrhea.  He still takes Plavix which he started after myocardial infarction 2001 and stenting of one of his coronary arteries.  His primary care physician Dr. Lovie Macadamia prescribes that for him.  ROS: complete GI ROS as described in HPI, all other review negative.  Constitutional:  No unintentional weight loss   Past Medical History:  Diagnosis Date  . Backache, unspecified   . Coronary atherosclerosis of unspecified type of vessel, native or graft   . Myocardial infarction ([place]) 2001  . Other and unspecified hyperlipidemia   . Other chronic nonalcoholic liver disease   . Other testicular hypofunction   . Overweight(278.02)   . Pneumonia   . Skin cancer 2002   right shoulder  . Type II or unspecified type diabetes mellitus without mention of complication, not stated as uncontrolled     Past Surgical History:  Procedure Laterality Date  . angiopolasty/stent  04/2000   one stent, inferior artery   . INGUINAL HERNIA REPAIR Bilateral     Current Outpatient Medications  Medication Sig Dispense Refill  . aspirin 81 MG tablet Take 81 mg by mouth daily.      Marland Kitchen atenolol (TENORMIN) 50 MG tablet TAKE 1 TABLET BY MOUTH  DAILY 90 tablet 0  . atorvastatin (LIPITOR) 80 MG tablet TAKE ONE-HALF TABLET BY  MOUTH DAILY 45 tablet 0  .  clopidogrel (PLAVIX) 75 MG tablet TAKE 1 TABLET BY MOUTH  DAILY 90 tablet 0  . glimepiride (AMARYL) 2 MG tablet TAKE ONE-HALF TABLET BY  MOUTH DAILY 45 tablet 0  . metFORMIN (GLUCOPHAGE) 500 MG tablet TAKE 2 TABLETS BY MOUTH TWO TIMES DAILY WITH MEALS 360 tablet 0  . saxagliptin HCl (ONGLYZA) 5 MG TABS tablet Take 1 tablet by mouth daily.     Current Facility-Administered Medications  Medication Dose Route Frequency Provider Last Rate Last Dose  . 0.9 %  sodium chloride infusion  500 mL Intravenous Continuous Milus Banister, MD        Allergies as of 09/19/2019  . (No Known Allergies)    Family History  Problem Relation Age of Onset  . Stroke Mother   . Alcohol abuse Mother   . Diabetes Father   . Colon cancer Neg Hx   . Colon polyps Neg Hx   . Rectal cancer Neg Hx   . Stomach cancer Neg Hx   . Prostate cancer Neg Hx   . Pancreatic cancer Neg Hx     Social History   Socioeconomic History  . Marital status: Married    Spouse name: debbie x 43 years  . Number of children: 1  . Years of education: Not on file  . Highest education level: Not on file  Occupational History  . Occupation: Dance movement psychotherapist at [employer]  . Financial  resource strain: Not on file  . Food insecurity    Worry: Not on file    Inability: Not on file  . Transportation needs    Medical: Not on file    Non-medical: Not on file  Tobacco Use  . Smoking status: Never Smoker  . Smokeless tobacco: Never Used  Substance and Sexual Activity  . Alcohol use: No  . Drug use: No  . Sexual activity: Not on file  Lifestyle  . Physical activity    Days per week: Not on file    Minutes per session: Not on file  . Stress: Not on file  Relationships  . Social Herbalist on phone: Not on file    Gets together: Not on file    Attends religious service: Not on file    Active member of club or organization: Not on file    Attends meetings of clubs or organizations: Not on file     Relationship status: Not on file  . Intimate partner violence    Fear of current or ex partner: Not on file    Emotionally abused: Not on file    Physically abused: Not on file    Forced sexual activity: Not on file  Other Topics Concern  . Not on file  Social History Narrative  . Not on file     Physical Exam: BP 110/70 (BP Location: Left Arm, Patient Position: Sitting, Cuff Size: Normal)   Pulse 64   Temp 97.9 F (36.6 C)   Ht 5' 10.75" (1.797 m) Comment: height measured without shoes  Wt 211 lb (95.7 kg)   BMI 29.64 kg/m  Constitutional: generally well-appearing Psychiatric: alert and oriented x3 Abdomen: soft, nontender, nondistended, no obvious ascites, no peritoneal signs, normal bowel sounds No peripheral edema noted in lower extremities  Assessment and plan: 68 y.o. male with personal history of adenomatous colon polyps  He understands that given his adenomatous colon polyps 3 years ago guidelines recommend repeat colonoscopy around this time.  He also understands being on Plavix puts him at slightly increased risk for procedural related bleeding issues and so I recommended that he stop the Plavix for 5 days prior to a repeat colonoscopy.  We will clear this recommendation with his primary care physician who prescribes his Plavix.  I see no reason for any further blood tests or imaging studies prior to then.  Please see the "Patient Instructions" section for addition details about the plan.  Owens Loffler, MD [facility] 09/19/2019, 8:57 AM

## 2019-09-19 NOTE — Patient Instructions (Signed)
You have been scheduled for a colonoscopy. Please follow written instructions given to you at your visit today.  Please pick up your prep supplies at the pharmacy within the next 1-3 days. If you use inhalers (even only as needed), please bring them with you on the day of your procedure.   You will be contacted by our office prior to your procedure for directions on holding your Plavix.  If you do not hear from our office 1 week prior to your scheduled procedure, please call [phone] to discuss.    We have sent the following medications to your pharmacy for you to pick up at your convenience: suprep  I appreciate the opportunity to care for you. Owens Loffler, MD

## 2019-09-19 NOTE — Telephone Encounter (Signed)
[facility] Pre-operative Risk Assessment     Request for surgical clearance:     Endoscopy Procedure  What type of surgery is being performed?     colonoscopy  When is this surgery scheduled?     09/30/2019  What type of clearance is required ?   Pharmacy  Are there any medications that need to be held prior to surgery and how long? Plavix , 5 days  Practice name and name of physician performing surgery?      [facility]  What is your office phone and fax number?      Phone- [phone]  [phone]  Anesthesia type (None, local, MAC, general) ?       MAC

## 2019-09-22 NOTE — Telephone Encounter (Signed)
I have left a detailed message (on Jennifer's voice mail) with Dr Reuel Boom office- [facility] [place] [facility]. They are closed on Mondays and Fridays the recording states. I hope to hear from them tomorrow in regards to patients Plavix.

## 2019-09-24 NOTE — Telephone Encounter (Signed)
We heard back from Dr Reuel Boom office with an approval to hold patient's Plavix for 5 days. He needs to start holding it tomorrow 09/25/2019. I have left patient a detailed message and also tried to reach his wife and I left her a detailed message. I will take his # and continue to try and reach him tonight.

## 2019-09-25 NOTE — Telephone Encounter (Signed)
I was able to reach Joe [name] and he verbalized understanding to hold his plavix for 5 days starting today 09/25/2019. I will send the fax we received from Dr Lovie Macadamia to be scanned into epic.

## 2019-09-26 ENCOUNTER — Telehealth: Payer: Self-pay | Admitting: Gastroenterology

## 2019-09-26 ENCOUNTER — Other Ambulatory Visit: Payer: Self-pay | Admitting: Gastroenterology

## 2019-09-26 ENCOUNTER — Ambulatory Visit: Payer: 59

## 2019-09-26 ENCOUNTER — Encounter: Payer: Self-pay | Admitting: Gastroenterology

## 2019-09-26 DIAGNOSIS — Z1159 Encounter for screening for other viral diseases: Secondary | ICD-10-CM

## 2019-09-26 NOTE — Telephone Encounter (Signed)
Pt is scheduled for a colonoscopy 12.15.20 and has questions regarding medication.

## 2019-09-26 NOTE — Telephone Encounter (Signed)
Returned call to pt. All questions answered.

## 2019-09-27 LAB — SARS CORONAVIRUS 2 (TAT 6-24 HRS): SARS Coronavirus 2: NEGATIVE

## 2019-09-30 ENCOUNTER — Encounter: Payer: Self-pay | Admitting: Gastroenterology

## 2019-09-30 ENCOUNTER — Other Ambulatory Visit: Payer: Self-pay

## 2019-09-30 ENCOUNTER — Ambulatory Visit (AMBULATORY_SURGERY_CENTER): Payer: 59 | Admitting: Gastroenterology

## 2019-09-30 VITALS — BP 116/58 | HR 71 | Temp 98.1°F | Resp 14 | Ht 70.75 in | Wt 221.0 lb

## 2019-09-30 DIAGNOSIS — D125 Benign neoplasm of sigmoid colon: Secondary | ICD-10-CM

## 2019-09-30 DIAGNOSIS — D122 Benign neoplasm of ascending colon: Secondary | ICD-10-CM | POA: Diagnosis not present

## 2019-09-30 DIAGNOSIS — Z86010 Personal history of colonic polyps: Secondary | ICD-10-CM

## 2019-09-30 MED ORDER — SODIUM CHLORIDE 0.9 % IV SOLN: 500.0000 mL | Freq: Once | INTRAVENOUS | Status: DC

## 2019-09-30 NOTE — Progress Notes (Signed)
Called to room to assist during endoscopic procedure.  Patient ID and intended procedure confirmed with present staff. Received instructions for my participation in the procedure from the performing physician.  

## 2019-09-30 NOTE — Progress Notes (Signed)
[place]

## 2019-09-30 NOTE — Op Note (Addendum)
[facility] Patient Name: Joe [name] Procedure Date: 09/30/2019 1:20 PM MRN: [id number] Endoscopist: Milus Banister , MD Age: 68 Referring MD:  Date of Birth: 1951-04-17 Gender: Male Account #: [id number] Procedure:                Colonoscopy Indications:              High risk colon cancer surveillance: Personal                            history of colonic polyps; Colonoscopy Dr. Verl Blalock 2007 found no polyps. Colonoscopy                            November 2017 Dr. Ardis Hughs found and removed 3                            subcentimeter polyps. These were all adenomas Medicines:                Monitored Anesthesia Care Procedure:                Pre-Anesthesia Assessment:                           - Prior to the procedure, a History and Physical                            was performed, and patient medications and                            allergies were reviewed. The patient's tolerance of                            previous anesthesia was also reviewed. The risks                            and benefits of the procedure and the sedation                            options and risks were discussed with the patient.                            All questions were answered, and informed consent                            was obtained. Prior Anticoagulants: The patient has                            taken Plavix (clopidogrel), last dose was 5 days                            prior to procedure. ASA Grade Assessment: III - A  patient with severe systemic disease. After                            reviewing the risks and benefits, the patient was                            deemed in satisfactory condition to undergo the                            procedure.                           After obtaining informed consent, the colonoscope                            was passed under direct vision. Throughout the              procedure, the patient's blood pressure, pulse, and                            oxygen saturations were monitored continuously. The                            Colonoscope was introduced through the anus and                            advanced to the the cecum, identified by                            appendiceal orifice and ileocecal valve. The                            colonoscopy was performed without difficulty. The                            patient tolerated the procedure well. The quality                            of the bowel preparation was good. The ileocecal                            valve, appendiceal orifice, and rectum were                            photographed. Scope In: 1:23:05 PM Scope Out: 1:37:09 PM Scope Withdrawal Time: 0 hours 11 minutes 9 seconds  Total Procedure Duration: 0 hours 14 minutes 4 seconds  Findings:                 Two sessile polyps were found in the sigmoid colon                            and ascending colon. The polyps were 2 to 4 mm in  size. These polyps were removed with a cold snare.                            Resection and retrieval were complete.                           The exam was otherwise without abnormality on                            direct and retroflexion views. Complications:            No immediate complications. Estimated blood loss:                            None. Estimated Blood Loss:     Estimated blood loss: none. Impression:               - Two 2 to 4 mm polyps in the sigmoid colon and in                            the ascending colon, removed with a cold snare.                            Resected and retrieved.                           - The examination was otherwise normal on direct                            and retroflexion views. Recommendation:           - Patient has a contact number available for                            emergencies. The signs and symptoms of potential                             delayed complications were discussed with the                            patient. Return to normal activities tomorrow.                            Written discharge instructions were provided to the                            patient.                           - Resume previous diet.                           - Continue present medications. OK to resume his                            plavix today.                           -  Await pathology results. Milus Banister, MD 09/30/2019 1:40:46 PM This report has been signed electronically.

## 2019-09-30 NOTE — Progress Notes (Signed)
Report to PACU, RN, vss, BBS= Clear.  

## 2019-09-30 NOTE — Patient Instructions (Signed)
Please see handouts given to you on POlyps. You may resume your Plavix today.     YOU HAD AN ENDOSCOPIC PROCEDURE TODAY AT [facility]:   Refer to the procedure report that was given to you for any specific questions about what was found during the examination.  If the procedure report does not answer your questions, please call your gastroenterologist to clarify.  If you requested that your care partner not be given the details of your procedure findings, then the procedure report has been included in a sealed envelope for you to review at your convenience later.  YOU SHOULD EXPECT: Some feelings of bloating in the abdomen. Passage of more gas than usual.  Walking can help get rid of the air that was put into your GI tract during the procedure and reduce the bloating. If you had a lower endoscopy (such as a colonoscopy or flexible sigmoidoscopy) you may notice spotting of blood in your stool or on the toilet paper. If you underwent a bowel prep for your procedure, you may not have a normal bowel movement for a few days.  Please Note:  You might notice some irritation and congestion in your nose or some drainage.  This is from the oxygen used during your procedure.  There is no need for concern and it should clear up in a day or so.  SYMPTOMS TO REPORT IMMEDIATELY:   Following lower endoscopy (colonoscopy or flexible sigmoidoscopy):  Excessive amounts of blood in the stool  Significant tenderness or worsening of abdominal pains  Swelling of the abdomen that is new, acute  Fever of 100F or higher   For urgent or emergent issues, a gastroenterologist can be reached at any hour by calling [phone].   DIET:  We do recommend a small meal at first, but then you may proceed to your regular diet.  Drink plenty of fluids but you should avoid alcoholic beverages for 24 hours.  ACTIVITY:  You should plan to take it easy for the rest of today and you should NOT DRIVE or use  heavy machinery until tomorrow (because of the sedation medicines used during the test).    FOLLOW UP: Our staff will call the number listed on your records 48-72 hours following your procedure to check on you and address any questions or concerns that you may have regarding the information given to you following your procedure. If we do not reach you, we will leave a message.  We will attempt to reach you two times.  During this call, we will ask if you have developed any symptoms of COVID 19. If you develop any symptoms (ie: fever, flu-like symptoms, shortness of breath, cough etc.) before then, please call [phone].  If you test positive for Covid 19 in the 2 weeks post procedure, please call and report this information to [place].    If any biopsies were taken you will be contacted by phone or by letter within the next 1-3 weeks.  Please call us at [phone] if you have not heard about the biopsies in 3 weeks.    SIGNATURES/CONFIDENTIALITY: You and/or your care partner have signed paperwork which will be entered into your electronic medical record.  These signatures attest to the fact that that the information above on your After Visit Summary has been reviewed and is understood.  Full responsibility of the confidentiality of this discharge information lies with you and/or your care-partner.

## 2019-10-02 ENCOUNTER — Telehealth: Payer: Self-pay | Admitting: *Deleted

## 2019-10-02 NOTE — Telephone Encounter (Signed)
Follow up call made, left message. 

## 2019-10-02 NOTE — Telephone Encounter (Signed)
  Follow up Call-  Call back number 09/30/2019  Post procedure Call Back phone  # [phone]  Permission to leave phone message Yes  Some recent data might be hidden     Patient questions:  Do you have a fever, pain , or abdominal swelling? No. Pain Score  0 *  Have you tolerated food without any problems? Yes.    Have you been able to return to your normal activities? No.  Do you have any questions about your discharge instructions: Diet   No. Medications  No. Follow up visit  No.  Do you have questions or concerns about your Care? No.  Actions: * If pain score is 4 or above: No action needed, pain <4.   1. Have you developed a fever since your procedure? no  2.   Have you had an respiratory symptoms (SOB or cough) since your procedure? no  3.   Have you tested positive for COVID 19 since your procedure no  4.   Have you had any family members/close contacts diagnosed with the COVID 19 since your procedure?  no   If yes to any of these questions please route to Joylene John, RN and Alphonsa Gin, Therapist, sports.

## 2019-10-06 ENCOUNTER — Encounter: Payer: Self-pay | Admitting: Gastroenterology

## 2019-10-29 DIAGNOSIS — H52223 Regular astigmatism, bilateral: Secondary | ICD-10-CM | POA: Diagnosis not present

## 2019-10-29 DIAGNOSIS — H5203 Hypermetropia, bilateral: Secondary | ICD-10-CM | POA: Diagnosis not present

## 2019-10-29 DIAGNOSIS — H524 Presbyopia: Secondary | ICD-10-CM | POA: Diagnosis not present

## 2019-10-29 DIAGNOSIS — H2513 Age-related nuclear cataract, bilateral: Secondary | ICD-10-CM | POA: Diagnosis not present

## 2019-10-29 DIAGNOSIS — H53021 Refractive amblyopia, right eye: Secondary | ICD-10-CM | POA: Diagnosis not present

## 2019-10-29 DIAGNOSIS — E119 Type 2 diabetes mellitus without complications: Secondary | ICD-10-CM | POA: Diagnosis not present

## 2019-10-29 DIAGNOSIS — Z7984 Long term (current) use of oral hypoglycemic drugs: Secondary | ICD-10-CM | POA: Diagnosis not present

## 2019-11-14 DIAGNOSIS — J309 Allergic rhinitis, unspecified: Secondary | ICD-10-CM | POA: Diagnosis not present

## 2019-11-14 DIAGNOSIS — Z0000 Encounter for general adult medical examination without abnormal findings: Secondary | ICD-10-CM | POA: Diagnosis not present

## 2019-11-14 DIAGNOSIS — E663 Overweight: Secondary | ICD-10-CM | POA: Diagnosis not present

## 2019-11-14 DIAGNOSIS — Z125 Encounter for screening for malignant neoplasm of prostate: Secondary | ICD-10-CM | POA: Diagnosis not present

## 2019-11-14 DIAGNOSIS — Z6828 Body mass index (BMI) 28.0-28.9, adult: Secondary | ICD-10-CM | POA: Diagnosis not present

## 2019-11-14 DIAGNOSIS — I10 Essential (primary) hypertension: Secondary | ICD-10-CM | POA: Diagnosis not present

## 2019-11-14 DIAGNOSIS — I2510 Atherosclerotic heart disease of native coronary artery without angina pectoris: Secondary | ICD-10-CM | POA: Diagnosis not present

## 2019-11-14 DIAGNOSIS — E785 Hyperlipidemia, unspecified: Secondary | ICD-10-CM | POA: Diagnosis not present

## 2019-11-14 DIAGNOSIS — E119 Type 2 diabetes mellitus without complications: Secondary | ICD-10-CM | POA: Diagnosis not present

## 2019-11-14 DIAGNOSIS — I252 Old myocardial infarction: Secondary | ICD-10-CM | POA: Diagnosis not present

## 2019-11-28 ENCOUNTER — Ambulatory Visit: Payer: Self-pay | Attending: Internal Medicine

## 2019-11-28 DIAGNOSIS — Z23 Encounter for immunization: Secondary | ICD-10-CM | POA: Insufficient documentation

## 2019-11-28 NOTE — Progress Notes (Signed)
   Covid-19 Vaccination Clinic  Name:  Joe [name]    MRN: [id number] DOB: 05/31/1951  11/28/2019  Joe [name] was observed post Covid-19 immunization for 15 minutes without incidence. He was provided with Vaccine Information Sheet and instruction to access the V-Safe system.   Joe [name] was instructed to call 911 with any severe reactions post vaccine: Marland Kitchen Difficulty breathing  . Swelling of your face and throat  . A fast heartbeat  . A bad rash all over your body  . Dizziness and weakness    Immunizations Administered    Name Date Dose VIS Date Route   Pfizer COVID-19 Vaccine 11/28/2019 11:51 AM 0.3 mL 09/26/2019 Intramuscular   Manufacturer: [place]   Lot: [id number]   [place]: [id number]

## 2019-12-23 ENCOUNTER — Ambulatory Visit: Payer: Self-pay | Attending: Internal Medicine

## 2019-12-23 DIAGNOSIS — Z23 Encounter for immunization: Secondary | ICD-10-CM | POA: Insufficient documentation

## 2019-12-23 NOTE — Progress Notes (Signed)
   Covid-19 Vaccination Clinic  Name:  Joe [name]    MRN: [id number] DOB: 24-Apr-1951  12/23/2019  [name] was observed post Covid-19 immunization for 15 minutes without incident. He was provided with Vaccine Information Sheet and instruction to access the V-Safe system.   [name] was instructed to call 911 with any severe reactions post vaccine: Marland Kitchen Difficulty breathing  . Swelling of face and throat  . A fast heartbeat  . A bad rash all over body  . Dizziness and weakness   Immunizations Administered    Name Date Dose VIS Date Route   Pfizer COVID-19 Vaccine 12/23/2019 12:21 PM 0.3 mL 09/26/2019 Intramuscular   Manufacturer: [place]   Lot: [id number]   [place]: [id number]

## 2020-04-12 DIAGNOSIS — E119 Type 2 diabetes mellitus without complications: Secondary | ICD-10-CM | POA: Diagnosis not present

## 2020-04-12 DIAGNOSIS — I10 Essential (primary) hypertension: Secondary | ICD-10-CM | POA: Diagnosis not present

## 2020-04-16 DIAGNOSIS — I252 Old myocardial infarction: Secondary | ICD-10-CM | POA: Diagnosis not present

## 2020-04-16 DIAGNOSIS — E119 Type 2 diabetes mellitus without complications: Secondary | ICD-10-CM | POA: Diagnosis not present

## 2020-04-16 DIAGNOSIS — I2510 Atherosclerotic heart disease of native coronary artery without angina pectoris: Secondary | ICD-10-CM | POA: Diagnosis not present

## 2020-04-16 DIAGNOSIS — Z23 Encounter for immunization: Secondary | ICD-10-CM | POA: Diagnosis not present

## 2020-04-16 DIAGNOSIS — E785 Hyperlipidemia, unspecified: Secondary | ICD-10-CM | POA: Diagnosis not present

## 2020-04-16 DIAGNOSIS — I10 Essential (primary) hypertension: Secondary | ICD-10-CM | POA: Diagnosis not present

## 2020-08-17 DIAGNOSIS — I10 Essential (primary) hypertension: Secondary | ICD-10-CM | POA: Diagnosis not present

## 2020-08-17 DIAGNOSIS — I25119 Atherosclerotic heart disease of native coronary artery with unspecified angina pectoris: Secondary | ICD-10-CM | POA: Diagnosis not present

## 2020-08-17 DIAGNOSIS — E785 Hyperlipidemia, unspecified: Secondary | ICD-10-CM | POA: Diagnosis not present

## 2020-08-17 DIAGNOSIS — I252 Old myocardial infarction: Secondary | ICD-10-CM | POA: Diagnosis not present

## 2020-08-17 DIAGNOSIS — E119 Type 2 diabetes mellitus without complications: Secondary | ICD-10-CM | POA: Diagnosis not present

## 2020-08-18 DIAGNOSIS — I25119 Atherosclerotic heart disease of native coronary artery with unspecified angina pectoris: Secondary | ICD-10-CM | POA: Diagnosis not present

## 2020-08-18 DIAGNOSIS — I252 Old myocardial infarction: Secondary | ICD-10-CM | POA: Diagnosis not present

## 2020-08-18 DIAGNOSIS — R0789 Other chest pain: Secondary | ICD-10-CM | POA: Diagnosis not present

## 2020-08-23 DIAGNOSIS — Z0100 Encounter for examination of eyes and vision without abnormal findings: Secondary | ICD-10-CM | POA: Diagnosis not present

## 2020-08-25 DIAGNOSIS — I2510 Atherosclerotic heart disease of native coronary artery without angina pectoris: Secondary | ICD-10-CM | POA: Diagnosis not present

## 2020-08-25 DIAGNOSIS — E782 Mixed hyperlipidemia: Secondary | ICD-10-CM | POA: Diagnosis not present

## 2020-08-25 DIAGNOSIS — Z01818 Encounter for other preprocedural examination: Secondary | ICD-10-CM | POA: Diagnosis not present

## 2020-08-25 DIAGNOSIS — N289 Disorder of kidney and ureter, unspecified: Secondary | ICD-10-CM | POA: Diagnosis not present

## 2020-08-25 DIAGNOSIS — I10 Essential (primary) hypertension: Secondary | ICD-10-CM | POA: Diagnosis not present

## 2020-08-26 DIAGNOSIS — I209 Angina pectoris, unspecified: Secondary | ICD-10-CM | POA: Diagnosis present

## 2020-08-31 ENCOUNTER — Other Ambulatory Visit
Admission: RE | Admit: 2020-08-31 | Discharge: 2020-08-31 | Disposition: A | Discharge status: Home / Self Care | Payer: Medicare HMO | Source: Ambulatory Visit | Attending: Internal Medicine | Admitting: Internal Medicine

## 2020-08-31 ENCOUNTER — Other Ambulatory Visit: Payer: Self-pay

## 2020-08-31 DIAGNOSIS — Z20822 Contact with and (suspected) exposure to covid-19: Secondary | ICD-10-CM | POA: Diagnosis not present

## 2020-08-31 DIAGNOSIS — Z01812 Encounter for preprocedural laboratory examination: Secondary | ICD-10-CM | POA: Diagnosis not present

## 2020-08-31 LAB — SARS CORONAVIRUS 2 (TAT 6-24 HRS): SARS Coronavirus 2: NEGATIVE

## 2020-09-02 ENCOUNTER — Ambulatory Visit
Admission: RE | Admit: 2020-09-02 | Discharge: 2020-09-02 | Disposition: A | Discharge status: Home / Self Care | Payer: Medicare HMO | Attending: Internal Medicine | Admitting: Internal Medicine

## 2020-09-02 ENCOUNTER — Other Ambulatory Visit: Payer: Self-pay

## 2020-09-02 ENCOUNTER — Encounter: Payer: Self-pay | Admitting: Internal Medicine

## 2020-09-02 ENCOUNTER — Encounter
Admission: RE | Disposition: A | Discharge status: Home / Self Care | Payer: Self-pay | Source: Home / Self Care | Attending: Internal Medicine

## 2020-09-02 DIAGNOSIS — Z79899 Other long term (current) drug therapy: Secondary | ICD-10-CM | POA: Diagnosis not present

## 2020-09-02 DIAGNOSIS — R9430 Abnormal result of cardiovascular function study, unspecified: Secondary | ICD-10-CM

## 2020-09-02 DIAGNOSIS — I209 Angina pectoris, unspecified: Secondary | ICD-10-CM | POA: Diagnosis present

## 2020-09-02 DIAGNOSIS — E785 Hyperlipidemia, unspecified: Secondary | ICD-10-CM | POA: Diagnosis not present

## 2020-09-02 DIAGNOSIS — Z7984 Long term (current) use of oral hypoglycemic drugs: Secondary | ICD-10-CM | POA: Insufficient documentation

## 2020-09-02 DIAGNOSIS — Z7982 Long term (current) use of aspirin: Secondary | ICD-10-CM | POA: Diagnosis not present

## 2020-09-02 DIAGNOSIS — I10 Essential (primary) hypertension: Secondary | ICD-10-CM | POA: Insufficient documentation

## 2020-09-02 DIAGNOSIS — I25110 Atherosclerotic heart disease of native coronary artery with unstable angina pectoris: Secondary | ICD-10-CM | POA: Insufficient documentation

## 2020-09-02 DIAGNOSIS — I200 Unstable angina: Secondary | ICD-10-CM

## 2020-09-02 DIAGNOSIS — Z955 Presence of coronary angioplasty implant and graft: Secondary | ICD-10-CM | POA: Insufficient documentation

## 2020-09-02 DIAGNOSIS — Z7902 Long term (current) use of antithrombotics/antiplatelets: Secondary | ICD-10-CM | POA: Insufficient documentation

## 2020-09-02 HISTORY — PX: LEFT HEART CATH AND CORONARY ANGIOGRAPHY: CATH118249

## 2020-09-02 LAB — GLUCOSE, CAPILLARY: Glucose-Capillary: 199 mg/dL — ABNORMAL HIGH (ref 70–99)

## 2020-09-02 SURGERY — LEFT HEART CATH AND CORONARY ANGIOGRAPHY
Anesthesia: Moderate Sedation | Laterality: Left

## 2020-09-02 MED ORDER — FENTANYL CITRATE (PF) 100 MCG/2ML IJ SOLN
INTRAMUSCULAR | Status: DC | PRN
  Administered 2020-09-02: 25 ug via INTRAVENOUS

## 2020-09-02 MED ORDER — VERAPAMIL HCL 2.5 MG/ML IV SOLN
INTRAVENOUS | Status: AC
  Filled 2020-09-02: qty 2

## 2020-09-02 MED ORDER — LIDOCAINE HCL (PF) 1 % IJ SOLN
INTRAMUSCULAR | Status: AC
  Filled 2020-09-02: qty 30

## 2020-09-02 MED ORDER — VERAPAMIL HCL 2.5 MG/ML IV SOLN
INTRAVENOUS | Status: DC | PRN
  Administered 2020-09-02: 2.5 mg via INTRA_ARTERIAL

## 2020-09-02 MED ORDER — SODIUM CHLORIDE 0.9 % WEIGHT BASED INFUSION: 1.0000 mL/kg/h | INTRAVENOUS | Status: DC

## 2020-09-02 MED ORDER — ONDANSETRON HCL 4 MG/2ML IJ SOLN: 4.0000 mg | Freq: Four times a day (QID) | INTRAMUSCULAR | Status: DC | PRN

## 2020-09-02 MED ORDER — IOHEXOL 300 MG/ML  SOLN
INTRAMUSCULAR | Status: DC | PRN
  Administered 2020-09-02: 135 mL

## 2020-09-02 MED ORDER — FENTANYL CITRATE (PF) 100 MCG/2ML IJ SOLN
INTRAMUSCULAR | Status: AC
  Filled 2020-09-02: qty 2

## 2020-09-02 MED ORDER — LABETALOL HCL 5 MG/ML IV SOLN: 10.0000 mg | INTRAVENOUS | Status: DC | PRN

## 2020-09-02 MED ORDER — ASPIRIN 81 MG PO CHEW: 81.0000 mg | CHEWABLE_TABLET | ORAL | Status: DC

## 2020-09-02 MED ORDER — SODIUM CHLORIDE 0.9 % IV SOLN: 250.0000 mL | INTRAVENOUS | Status: DC | PRN

## 2020-09-02 MED ORDER — HEPARIN SODIUM (PORCINE) 1000 UNIT/ML IJ SOLN
INTRAMUSCULAR | Status: DC | PRN
  Administered 2020-09-02: 5000 [IU] via INTRAVENOUS

## 2020-09-02 MED ORDER — HEPARIN (PORCINE) IN NACL 1000-0.9 UT/500ML-% IV SOLN
INTRAVENOUS | Status: DC | PRN
  Administered 2020-09-02: 500 mL

## 2020-09-02 MED ORDER — SODIUM CHLORIDE 0.9 % WEIGHT BASED INFUSION
282.9000 mL/h | INTRAVENOUS | Status: AC
  Administered 2020-09-02: 3 mL/kg/h via INTRAVENOUS

## 2020-09-02 MED ORDER — HEPARIN SODIUM (PORCINE) 1000 UNIT/ML IJ SOLN
INTRAMUSCULAR | Status: AC
  Filled 2020-09-02: qty 1

## 2020-09-02 MED ORDER — LIDOCAINE HCL (PF) 1 % IJ SOLN
INTRAMUSCULAR | Status: DC | PRN
  Administered 2020-09-02: 2 mL

## 2020-09-02 MED ORDER — SODIUM CHLORIDE 0.9% FLUSH: 3.0000 mL | Freq: Two times a day (BID) | INTRAVENOUS | Status: DC

## 2020-09-02 MED ORDER — ACETAMINOPHEN 325 MG PO TABS: 650.0000 mg | ORAL_TABLET | ORAL | Status: DC | PRN

## 2020-09-02 MED ORDER — MIDAZOLAM HCL 2 MG/2ML IJ SOLN
INTRAMUSCULAR | Status: DC | PRN
  Administered 2020-09-02: 1 mg via INTRAVENOUS

## 2020-09-02 MED ORDER — MIDAZOLAM HCL 2 MG/2ML IJ SOLN
INTRAMUSCULAR | Status: AC
  Filled 2020-09-02: qty 2

## 2020-09-02 MED ORDER — HYDRALAZINE HCL 20 MG/ML IJ SOLN: 10.0000 mg | INTRAMUSCULAR | Status: DC | PRN

## 2020-09-02 MED ORDER — HEPARIN (PORCINE) IN NACL 1000-0.9 UT/500ML-% IV SOLN
INTRAVENOUS | Status: AC
  Filled 2020-09-02: qty 1000

## 2020-09-02 MED ORDER — SODIUM CHLORIDE 0.9% FLUSH: 3.0000 mL | INTRAVENOUS | Status: DC | PRN

## 2020-09-02 SURGICAL SUPPLY — 8 items
CATH INFINITI 5 FR JL3.5 (CATHETERS) ×2 IMPLANT
CATH INFINITI JR4 5F (CATHETERS) ×2 IMPLANT
DEVICE RAD TR BAND REGULAR (VASCULAR PRODUCTS) ×2 IMPLANT
GLIDESHEATH SLEND SS 6F .021 (SHEATH) ×2 IMPLANT
GUIDEWIRE INQWIRE 1.5J.035X260 (WIRE) IMPLANT
INQWIRE 1.5J .035X260CM (WIRE) ×3
KIT MANI 3VAL PERCEP (MISCELLANEOUS) ×3 IMPLANT
PACK CARDIAC CATH (CUSTOM PROCEDURE TRAY) ×3 IMPLANT

## 2020-09-03 DIAGNOSIS — I25118 Atherosclerotic heart disease of native coronary artery with other forms of angina pectoris: Secondary | ICD-10-CM | POA: Diagnosis not present

## 2020-09-03 DIAGNOSIS — N289 Disorder of kidney and ureter, unspecified: Secondary | ICD-10-CM | POA: Diagnosis not present

## 2020-09-03 DIAGNOSIS — I10 Essential (primary) hypertension: Secondary | ICD-10-CM | POA: Diagnosis not present

## 2020-09-03 DIAGNOSIS — Z23 Encounter for immunization: Secondary | ICD-10-CM | POA: Diagnosis not present

## 2020-09-03 DIAGNOSIS — E119 Type 2 diabetes mellitus without complications: Secondary | ICD-10-CM | POA: Diagnosis not present

## 2020-09-03 DIAGNOSIS — E782 Mixed hyperlipidemia: Secondary | ICD-10-CM | POA: Diagnosis not present

## 2020-09-13 DIAGNOSIS — I25118 Atherosclerotic heart disease of native coronary artery with other forms of angina pectoris: Secondary | ICD-10-CM | POA: Diagnosis not present

## 2020-09-27 DIAGNOSIS — Z951 Presence of aortocoronary bypass graft: Secondary | ICD-10-CM | POA: Diagnosis not present

## 2020-09-27 DIAGNOSIS — E785 Hyperlipidemia, unspecified: Secondary | ICD-10-CM | POA: Diagnosis not present

## 2020-09-27 DIAGNOSIS — I97190 Other postprocedural cardiac functional disturbances following cardiac surgery: Secondary | ICD-10-CM | POA: Diagnosis not present

## 2020-09-27 DIAGNOSIS — Z20822 Contact with and (suspected) exposure to covid-19: Secondary | ICD-10-CM | POA: Diagnosis not present

## 2020-09-27 DIAGNOSIS — D6489 Other specified anemias: Secondary | ICD-10-CM | POA: Diagnosis not present

## 2020-09-27 DIAGNOSIS — I25118 Atherosclerotic heart disease of native coronary artery with other forms of angina pectoris: Secondary | ICD-10-CM | POA: Diagnosis not present

## 2020-09-27 DIAGNOSIS — Z9889 Other specified postprocedural states: Secondary | ICD-10-CM | POA: Diagnosis not present

## 2020-09-27 DIAGNOSIS — J9811 Atelectasis: Secondary | ICD-10-CM | POA: Diagnosis not present

## 2020-09-27 DIAGNOSIS — I10 Essential (primary) hypertension: Secondary | ICD-10-CM | POA: Diagnosis not present

## 2020-09-27 DIAGNOSIS — R918 Other nonspecific abnormal finding of lung field: Secondary | ICD-10-CM | POA: Diagnosis not present

## 2020-09-27 DIAGNOSIS — E1165 Type 2 diabetes mellitus with hyperglycemia: Secondary | ICD-10-CM | POA: Diagnosis not present

## 2020-09-27 DIAGNOSIS — E1169 Type 2 diabetes mellitus with other specified complication: Secondary | ICD-10-CM | POA: Diagnosis not present

## 2020-09-27 DIAGNOSIS — Z4682 Encounter for fitting and adjustment of non-vascular catheter: Secondary | ICD-10-CM | POA: Diagnosis not present

## 2020-09-27 DIAGNOSIS — E119 Type 2 diabetes mellitus without complications: Secondary | ICD-10-CM | POA: Diagnosis not present

## 2020-09-27 DIAGNOSIS — Z452 Encounter for adjustment and management of vascular access device: Secondary | ICD-10-CM | POA: Diagnosis not present

## 2020-09-27 DIAGNOSIS — I4891 Unspecified atrial fibrillation: Secondary | ICD-10-CM | POA: Diagnosis not present

## 2020-09-27 DIAGNOSIS — D72829 Elevated white blood cell count, unspecified: Secondary | ICD-10-CM | POA: Diagnosis not present

## 2020-09-27 DIAGNOSIS — G8918 Other acute postprocedural pain: Secondary | ICD-10-CM | POA: Diagnosis not present

## 2020-09-27 DIAGNOSIS — I2510 Atherosclerotic heart disease of native coronary artery without angina pectoris: Secondary | ICD-10-CM | POA: Diagnosis not present

## 2020-09-28 DIAGNOSIS — I25118 Atherosclerotic heart disease of native coronary artery with other forms of angina pectoris: Secondary | ICD-10-CM | POA: Diagnosis not present

## 2020-09-28 DIAGNOSIS — J9811 Atelectasis: Secondary | ICD-10-CM | POA: Diagnosis not present

## 2020-09-29 DIAGNOSIS — G8918 Other acute postprocedural pain: Secondary | ICD-10-CM | POA: Diagnosis not present

## 2020-09-29 DIAGNOSIS — I2510 Atherosclerotic heart disease of native coronary artery without angina pectoris: Secondary | ICD-10-CM | POA: Diagnosis not present

## 2020-09-29 DIAGNOSIS — J9811 Atelectasis: Secondary | ICD-10-CM | POA: Diagnosis not present

## 2020-09-29 DIAGNOSIS — Z4682 Encounter for fitting and adjustment of non-vascular catheter: Secondary | ICD-10-CM | POA: Diagnosis not present

## 2020-09-29 DIAGNOSIS — Z951 Presence of aortocoronary bypass graft: Secondary | ICD-10-CM | POA: Diagnosis not present

## 2020-09-29 DIAGNOSIS — E1165 Type 2 diabetes mellitus with hyperglycemia: Secondary | ICD-10-CM | POA: Diagnosis not present

## 2020-09-29 DIAGNOSIS — I10 Essential (primary) hypertension: Secondary | ICD-10-CM | POA: Diagnosis not present

## 2020-09-29 DIAGNOSIS — I25118 Atherosclerotic heart disease of native coronary artery with other forms of angina pectoris: Secondary | ICD-10-CM | POA: Diagnosis not present

## 2020-09-29 DIAGNOSIS — Z452 Encounter for adjustment and management of vascular access device: Secondary | ICD-10-CM | POA: Diagnosis not present

## 2020-09-30 DIAGNOSIS — I25118 Atherosclerotic heart disease of native coronary artery with other forms of angina pectoris: Secondary | ICD-10-CM | POA: Diagnosis not present

## 2020-09-30 DIAGNOSIS — Z4682 Encounter for fitting and adjustment of non-vascular catheter: Secondary | ICD-10-CM | POA: Diagnosis not present

## 2020-09-30 DIAGNOSIS — R918 Other nonspecific abnormal finding of lung field: Secondary | ICD-10-CM | POA: Diagnosis not present

## 2020-09-30 DIAGNOSIS — E1165 Type 2 diabetes mellitus with hyperglycemia: Secondary | ICD-10-CM | POA: Diagnosis not present

## 2020-09-30 DIAGNOSIS — Z951 Presence of aortocoronary bypass graft: Secondary | ICD-10-CM | POA: Diagnosis not present

## 2020-10-01 DIAGNOSIS — I25118 Atherosclerotic heart disease of native coronary artery with other forms of angina pectoris: Secondary | ICD-10-CM | POA: Diagnosis not present

## 2020-10-01 DIAGNOSIS — Z9889 Other specified postprocedural states: Secondary | ICD-10-CM | POA: Diagnosis not present

## 2020-10-01 DIAGNOSIS — Z951 Presence of aortocoronary bypass graft: Secondary | ICD-10-CM | POA: Diagnosis not present

## 2020-10-01 DIAGNOSIS — E1169 Type 2 diabetes mellitus with other specified complication: Secondary | ICD-10-CM | POA: Diagnosis not present

## 2020-10-02 DIAGNOSIS — Z452 Encounter for adjustment and management of vascular access device: Secondary | ICD-10-CM | POA: Diagnosis not present

## 2020-10-02 DIAGNOSIS — E119 Type 2 diabetes mellitus without complications: Secondary | ICD-10-CM | POA: Diagnosis not present

## 2020-10-02 DIAGNOSIS — Z951 Presence of aortocoronary bypass graft: Secondary | ICD-10-CM | POA: Diagnosis not present

## 2020-10-03 DIAGNOSIS — Z951 Presence of aortocoronary bypass graft: Secondary | ICD-10-CM | POA: Diagnosis not present

## 2020-10-03 DIAGNOSIS — E119 Type 2 diabetes mellitus without complications: Secondary | ICD-10-CM | POA: Diagnosis not present

## 2020-10-11 DIAGNOSIS — I10 Essential (primary) hypertension: Secondary | ICD-10-CM | POA: Diagnosis not present

## 2020-10-11 DIAGNOSIS — E119 Type 2 diabetes mellitus without complications: Secondary | ICD-10-CM | POA: Diagnosis not present

## 2020-10-11 DIAGNOSIS — Z951 Presence of aortocoronary bypass graft: Secondary | ICD-10-CM | POA: Diagnosis not present

## 2020-10-11 DIAGNOSIS — D649 Anemia, unspecified: Secondary | ICD-10-CM | POA: Diagnosis not present

## 2020-10-11 DIAGNOSIS — E782 Mixed hyperlipidemia: Secondary | ICD-10-CM | POA: Diagnosis not present

## 2020-10-11 DIAGNOSIS — I4891 Unspecified atrial fibrillation: Secondary | ICD-10-CM | POA: Diagnosis not present

## 2020-10-11 DIAGNOSIS — I25118 Atherosclerotic heart disease of native coronary artery with other forms of angina pectoris: Secondary | ICD-10-CM | POA: Diagnosis not present

## 2020-10-25 DIAGNOSIS — Z951 Presence of aortocoronary bypass graft: Secondary | ICD-10-CM | POA: Diagnosis not present

## 2020-10-25 DIAGNOSIS — Z9889 Other specified postprocedural states: Secondary | ICD-10-CM | POA: Diagnosis not present

## 2020-10-25 DIAGNOSIS — Z48812 Encounter for surgical aftercare following surgery on the circulatory system: Secondary | ICD-10-CM | POA: Diagnosis not present

## 2020-10-25 DIAGNOSIS — Z79899 Other long term (current) drug therapy: Secondary | ICD-10-CM | POA: Diagnosis not present

## 2020-10-25 DIAGNOSIS — I452 Bifascicular block: Secondary | ICD-10-CM | POA: Diagnosis not present

## 2020-10-25 DIAGNOSIS — I252 Old myocardial infarction: Secondary | ICD-10-CM | POA: Diagnosis not present

## 2020-10-25 DIAGNOSIS — Z7901 Long term (current) use of anticoagulants: Secondary | ICD-10-CM | POA: Diagnosis not present

## 2020-10-25 DIAGNOSIS — R918 Other nonspecific abnormal finding of lung field: Secondary | ICD-10-CM | POA: Diagnosis not present

## 2020-11-04 DIAGNOSIS — I480 Paroxysmal atrial fibrillation: Secondary | ICD-10-CM | POA: Diagnosis not present

## 2020-11-04 DIAGNOSIS — I25118 Atherosclerotic heart disease of native coronary artery with other forms of angina pectoris: Secondary | ICD-10-CM | POA: Diagnosis not present

## 2020-11-04 DIAGNOSIS — I10 Essential (primary) hypertension: Secondary | ICD-10-CM | POA: Diagnosis not present

## 2020-11-04 DIAGNOSIS — I25810 Atherosclerosis of coronary artery bypass graft(s) without angina pectoris: Secondary | ICD-10-CM | POA: Diagnosis not present

## 2020-11-04 DIAGNOSIS — E782 Mixed hyperlipidemia: Secondary | ICD-10-CM | POA: Diagnosis not present

## 2020-11-08 DIAGNOSIS — E785 Hyperlipidemia, unspecified: Secondary | ICD-10-CM | POA: Diagnosis not present

## 2020-11-08 DIAGNOSIS — I10 Essential (primary) hypertension: Secondary | ICD-10-CM | POA: Diagnosis not present

## 2020-11-08 DIAGNOSIS — Z125 Encounter for screening for malignant neoplasm of prostate: Secondary | ICD-10-CM | POA: Diagnosis not present

## 2020-11-08 DIAGNOSIS — E119 Type 2 diabetes mellitus without complications: Secondary | ICD-10-CM | POA: Diagnosis not present

## 2020-11-15 DIAGNOSIS — I10 Essential (primary) hypertension: Secondary | ICD-10-CM | POA: Diagnosis not present

## 2020-11-15 DIAGNOSIS — Z0000 Encounter for general adult medical examination without abnormal findings: Secondary | ICD-10-CM | POA: Diagnosis not present

## 2020-11-15 DIAGNOSIS — I25810 Atherosclerosis of coronary artery bypass graft(s) without angina pectoris: Secondary | ICD-10-CM | POA: Diagnosis not present

## 2020-11-15 DIAGNOSIS — I480 Paroxysmal atrial fibrillation: Secondary | ICD-10-CM | POA: Diagnosis not present

## 2020-11-15 DIAGNOSIS — E119 Type 2 diabetes mellitus without complications: Secondary | ICD-10-CM | POA: Diagnosis not present

## 2020-11-15 DIAGNOSIS — E782 Mixed hyperlipidemia: Secondary | ICD-10-CM | POA: Diagnosis not present

## 2021-01-06 DIAGNOSIS — I25810 Atherosclerosis of coronary artery bypass graft(s) without angina pectoris: Secondary | ICD-10-CM | POA: Diagnosis not present

## 2021-01-06 DIAGNOSIS — E782 Mixed hyperlipidemia: Secondary | ICD-10-CM | POA: Diagnosis not present

## 2021-01-06 DIAGNOSIS — I10 Essential (primary) hypertension: Secondary | ICD-10-CM | POA: Diagnosis not present

## 2021-01-25 DIAGNOSIS — I25810 Atherosclerosis of coronary artery bypass graft(s) without angina pectoris: Secondary | ICD-10-CM | POA: Diagnosis not present

## 2021-03-08 DIAGNOSIS — E119 Type 2 diabetes mellitus without complications: Secondary | ICD-10-CM | POA: Diagnosis not present

## 2021-03-08 DIAGNOSIS — I25810 Atherosclerosis of coronary artery bypass graft(s) without angina pectoris: Secondary | ICD-10-CM | POA: Diagnosis not present

## 2021-03-08 DIAGNOSIS — E782 Mixed hyperlipidemia: Secondary | ICD-10-CM | POA: Diagnosis not present

## 2021-03-08 DIAGNOSIS — I10 Essential (primary) hypertension: Secondary | ICD-10-CM | POA: Diagnosis not present

## 2021-03-08 DIAGNOSIS — I480 Paroxysmal atrial fibrillation: Secondary | ICD-10-CM | POA: Diagnosis not present

## 2021-05-09 DIAGNOSIS — E119 Type 2 diabetes mellitus without complications: Secondary | ICD-10-CM | POA: Diagnosis not present

## 2021-05-09 DIAGNOSIS — I10 Essential (primary) hypertension: Secondary | ICD-10-CM | POA: Diagnosis not present

## 2021-05-09 DIAGNOSIS — E782 Mixed hyperlipidemia: Secondary | ICD-10-CM | POA: Diagnosis not present

## 2021-05-16 DIAGNOSIS — Z125 Encounter for screening for malignant neoplasm of prostate: Secondary | ICD-10-CM | POA: Diagnosis not present

## 2021-05-16 DIAGNOSIS — Z23 Encounter for immunization: Secondary | ICD-10-CM | POA: Diagnosis not present

## 2021-05-16 DIAGNOSIS — E119 Type 2 diabetes mellitus without complications: Secondary | ICD-10-CM | POA: Diagnosis not present

## 2021-05-16 DIAGNOSIS — E782 Mixed hyperlipidemia: Secondary | ICD-10-CM | POA: Diagnosis not present

## 2021-05-16 DIAGNOSIS — M25561 Pain in right knee: Secondary | ICD-10-CM | POA: Diagnosis not present

## 2021-05-16 DIAGNOSIS — M25562 Pain in left knee: Secondary | ICD-10-CM | POA: Diagnosis not present

## 2021-05-16 DIAGNOSIS — I10 Essential (primary) hypertension: Secondary | ICD-10-CM | POA: Diagnosis not present

## 2021-05-16 DIAGNOSIS — I480 Paroxysmal atrial fibrillation: Secondary | ICD-10-CM | POA: Diagnosis not present

## 2021-08-16 DIAGNOSIS — H53021 Refractive amblyopia, right eye: Secondary | ICD-10-CM | POA: Diagnosis not present

## 2021-08-16 DIAGNOSIS — E119 Type 2 diabetes mellitus without complications: Secondary | ICD-10-CM | POA: Diagnosis not present

## 2021-08-16 DIAGNOSIS — H524 Presbyopia: Secondary | ICD-10-CM | POA: Diagnosis not present

## 2021-08-16 DIAGNOSIS — H5203 Hypermetropia, bilateral: Secondary | ICD-10-CM | POA: Diagnosis not present

## 2021-08-16 DIAGNOSIS — H2513 Age-related nuclear cataract, bilateral: Secondary | ICD-10-CM | POA: Diagnosis not present

## 2021-08-16 DIAGNOSIS — H52223 Regular astigmatism, bilateral: Secondary | ICD-10-CM | POA: Diagnosis not present

## 2021-08-16 DIAGNOSIS — Z7984 Long term (current) use of oral hypoglycemic drugs: Secondary | ICD-10-CM | POA: Diagnosis not present

## 2021-11-10 DIAGNOSIS — E119 Type 2 diabetes mellitus without complications: Secondary | ICD-10-CM | POA: Diagnosis not present

## 2021-11-10 DIAGNOSIS — Z125 Encounter for screening for malignant neoplasm of prostate: Secondary | ICD-10-CM | POA: Diagnosis not present

## 2021-11-10 DIAGNOSIS — I10 Essential (primary) hypertension: Secondary | ICD-10-CM | POA: Diagnosis not present

## 2021-11-17 DIAGNOSIS — I480 Paroxysmal atrial fibrillation: Secondary | ICD-10-CM | POA: Diagnosis not present

## 2021-11-17 DIAGNOSIS — E119 Type 2 diabetes mellitus without complications: Secondary | ICD-10-CM | POA: Diagnosis not present

## 2021-11-17 DIAGNOSIS — Z0000 Encounter for general adult medical examination without abnormal findings: Secondary | ICD-10-CM | POA: Diagnosis not present

## 2021-11-17 DIAGNOSIS — I10 Essential (primary) hypertension: Secondary | ICD-10-CM | POA: Diagnosis not present

## 2021-11-17 DIAGNOSIS — I25810 Atherosclerosis of coronary artery bypass graft(s) without angina pectoris: Secondary | ICD-10-CM | POA: Diagnosis not present

## 2021-11-17 DIAGNOSIS — E782 Mixed hyperlipidemia: Secondary | ICD-10-CM | POA: Diagnosis not present

## 2021-12-12 DIAGNOSIS — I10 Essential (primary) hypertension: Secondary | ICD-10-CM | POA: Diagnosis not present

## 2021-12-12 DIAGNOSIS — I480 Paroxysmal atrial fibrillation: Secondary | ICD-10-CM | POA: Diagnosis not present

## 2021-12-12 DIAGNOSIS — Z951 Presence of aortocoronary bypass graft: Secondary | ICD-10-CM | POA: Diagnosis not present

## 2021-12-12 DIAGNOSIS — E782 Mixed hyperlipidemia: Secondary | ICD-10-CM | POA: Diagnosis not present

## 2021-12-12 DIAGNOSIS — I25810 Atherosclerosis of coronary artery bypass graft(s) without angina pectoris: Secondary | ICD-10-CM | POA: Diagnosis not present

## 2022-02-28 DIAGNOSIS — E119 Type 2 diabetes mellitus without complications: Secondary | ICD-10-CM | POA: Diagnosis not present

## 2022-02-28 DIAGNOSIS — H53021 Refractive amblyopia, right eye: Secondary | ICD-10-CM | POA: Diagnosis not present

## 2022-02-28 DIAGNOSIS — H2513 Age-related nuclear cataract, bilateral: Secondary | ICD-10-CM | POA: Diagnosis not present

## 2022-02-28 DIAGNOSIS — Z7984 Long term (current) use of oral hypoglycemic drugs: Secondary | ICD-10-CM | POA: Diagnosis not present

## 2022-02-28 DIAGNOSIS — H5203 Hypermetropia, bilateral: Secondary | ICD-10-CM | POA: Diagnosis not present

## 2022-02-28 DIAGNOSIS — H52223 Regular astigmatism, bilateral: Secondary | ICD-10-CM | POA: Diagnosis not present

## 2022-02-28 DIAGNOSIS — H524 Presbyopia: Secondary | ICD-10-CM | POA: Diagnosis not present

## 2022-05-25 DIAGNOSIS — H25013 Cortical age-related cataract, bilateral: Secondary | ICD-10-CM | POA: Diagnosis not present

## 2022-05-25 DIAGNOSIS — H18413 Arcus senilis, bilateral: Secondary | ICD-10-CM | POA: Diagnosis not present

## 2022-05-25 DIAGNOSIS — H2513 Age-related nuclear cataract, bilateral: Secondary | ICD-10-CM | POA: Diagnosis not present

## 2022-05-25 DIAGNOSIS — H2511 Age-related nuclear cataract, right eye: Secondary | ICD-10-CM | POA: Diagnosis not present

## 2022-05-25 DIAGNOSIS — H25043 Posterior subcapsular polar age-related cataract, bilateral: Secondary | ICD-10-CM | POA: Diagnosis not present

## 2022-08-09 DIAGNOSIS — H25011 Cortical age-related cataract, right eye: Secondary | ICD-10-CM | POA: Diagnosis not present

## 2022-08-09 DIAGNOSIS — H2511 Age-related nuclear cataract, right eye: Secondary | ICD-10-CM | POA: Diagnosis not present

## 2022-08-10 DIAGNOSIS — H25042 Posterior subcapsular polar age-related cataract, left eye: Secondary | ICD-10-CM | POA: Diagnosis not present

## 2022-08-10 DIAGNOSIS — H25012 Cortical age-related cataract, left eye: Secondary | ICD-10-CM | POA: Diagnosis not present

## 2022-08-10 DIAGNOSIS — H2512 Age-related nuclear cataract, left eye: Secondary | ICD-10-CM | POA: Diagnosis not present

## 2022-08-18 DIAGNOSIS — H2512 Age-related nuclear cataract, left eye: Secondary | ICD-10-CM | POA: Diagnosis not present

## 2022-08-18 DIAGNOSIS — H25012 Cortical age-related cataract, left eye: Secondary | ICD-10-CM | POA: Diagnosis not present

## 2022-09-14 DIAGNOSIS — I480 Paroxysmal atrial fibrillation: Secondary | ICD-10-CM | POA: Diagnosis not present

## 2022-09-14 DIAGNOSIS — I10 Essential (primary) hypertension: Secondary | ICD-10-CM | POA: Diagnosis not present

## 2022-09-14 DIAGNOSIS — I25810 Atherosclerosis of coronary artery bypass graft(s) without angina pectoris: Secondary | ICD-10-CM | POA: Diagnosis not present

## 2022-09-14 DIAGNOSIS — E782 Mixed hyperlipidemia: Secondary | ICD-10-CM | POA: Diagnosis not present

## 2022-09-14 DIAGNOSIS — Z951 Presence of aortocoronary bypass graft: Secondary | ICD-10-CM | POA: Diagnosis not present

## 2022-09-20 DIAGNOSIS — Z0100 Encounter for examination of eyes and vision without abnormal findings: Secondary | ICD-10-CM | POA: Diagnosis not present

## 2022-10-04 ENCOUNTER — Ambulatory Visit: Payer: Medicare HMO | Admitting: Podiatry

## 2022-10-04 ENCOUNTER — Encounter: Payer: Self-pay | Admitting: Podiatry

## 2022-10-04 DIAGNOSIS — M6740 Ganglion, unspecified site: Secondary | ICD-10-CM

## 2022-10-04 DIAGNOSIS — E119 Type 2 diabetes mellitus without complications: Secondary | ICD-10-CM

## 2022-10-04 DIAGNOSIS — M722 Plantar fascial fibromatosis: Secondary | ICD-10-CM

## 2022-10-04 NOTE — Progress Notes (Signed)
  Subjective:  Patient ID: Joe [name], male    DOB: [birth year],  MRN: [id number]  Chief Complaint  Patient presents with   Nail Problem    Thick painful toenails     71 y.o. male presents with the above complaint. History confirmed with patient.  He is here for diabetic foot exam.  He has an A1c of about 8%.  Denies numbness tingling burning or pain.  Has a small cyst on the right second toe.  Also has non-painful lumps in arch of the left foot  Objective:  Physical Exam: warm, good capillary refill, no trophic changes or ulcerative lesions, normal DP and PT pulses, normal monofilament exam, and normal sensory exam. Left Foot:  Nontender plantar fibromas medial band Right Foot: Mucoid cyst nontender right second toe DIPJ  Assessment:   1. Type 2 diabetes mellitus without complication, without long-term current use of insulin ([place])   2. Plantar fibromatosis   3. Mucoid cyst of joint      Plan:  Patient was evaluated and treated and all questions answered.  Patient educated on diabetes. Discussed proper diabetic foot care and discussed risks and complications of disease. Educated patient in depth on reasons to return to the office immediately should he/she discover anything concerning or new on the feet. All questions answered. Discussed proper shoes as well.  Overall low risk complications discussed importance of lowering his A1c will return in 1 year for diabetic foot exam  Mucoid cyst and plantar fibromas are currently nonpainful we discussed etiology and treatment options of these.  Will continue to monitor closely, if they become painful he will return for this.  Return in about 1 year (around 10/05/2023) for Diabetic foot exam, diabetic foot exam.

## 2023-01-10 DIAGNOSIS — E119 Type 2 diabetes mellitus without complications: Secondary | ICD-10-CM | POA: Diagnosis not present

## 2023-01-10 DIAGNOSIS — E782 Mixed hyperlipidemia: Secondary | ICD-10-CM | POA: Diagnosis not present

## 2023-01-10 DIAGNOSIS — Z125 Encounter for screening for malignant neoplasm of prostate: Secondary | ICD-10-CM | POA: Diagnosis not present

## 2023-01-17 DIAGNOSIS — Z0000 Encounter for general adult medical examination without abnormal findings: Secondary | ICD-10-CM | POA: Diagnosis not present

## 2023-01-17 DIAGNOSIS — E785 Hyperlipidemia, unspecified: Secondary | ICD-10-CM | POA: Diagnosis not present

## 2023-01-17 DIAGNOSIS — Z1331 Encounter for screening for depression: Secondary | ICD-10-CM | POA: Diagnosis not present

## 2023-01-17 DIAGNOSIS — I480 Paroxysmal atrial fibrillation: Secondary | ICD-10-CM | POA: Diagnosis not present

## 2023-01-17 DIAGNOSIS — I10 Essential (primary) hypertension: Secondary | ICD-10-CM | POA: Diagnosis not present

## 2023-01-17 DIAGNOSIS — E119 Type 2 diabetes mellitus without complications: Secondary | ICD-10-CM | POA: Diagnosis not present

## 2023-01-17 DIAGNOSIS — I2510 Atherosclerotic heart disease of native coronary artery without angina pectoris: Secondary | ICD-10-CM | POA: Diagnosis not present

## 2023-04-18 DIAGNOSIS — E119 Type 2 diabetes mellitus without complications: Secondary | ICD-10-CM | POA: Diagnosis not present

## 2023-05-07 DIAGNOSIS — I2510 Atherosclerotic heart disease of native coronary artery without angina pectoris: Secondary | ICD-10-CM | POA: Diagnosis not present

## 2023-05-07 DIAGNOSIS — I480 Paroxysmal atrial fibrillation: Secondary | ICD-10-CM | POA: Diagnosis not present

## 2023-05-07 DIAGNOSIS — E785 Hyperlipidemia, unspecified: Secondary | ICD-10-CM | POA: Diagnosis not present

## 2023-05-07 DIAGNOSIS — I10 Essential (primary) hypertension: Secondary | ICD-10-CM | POA: Diagnosis not present

## 2023-05-07 DIAGNOSIS — E119 Type 2 diabetes mellitus without complications: Secondary | ICD-10-CM | POA: Diagnosis not present

## 2023-06-14 DIAGNOSIS — Z951 Presence of aortocoronary bypass graft: Secondary | ICD-10-CM | POA: Diagnosis not present

## 2023-06-14 DIAGNOSIS — I10 Essential (primary) hypertension: Secondary | ICD-10-CM | POA: Diagnosis not present

## 2023-06-14 DIAGNOSIS — I480 Paroxysmal atrial fibrillation: Secondary | ICD-10-CM | POA: Diagnosis not present

## 2023-06-14 DIAGNOSIS — I25810 Atherosclerosis of coronary artery bypass graft(s) without angina pectoris: Secondary | ICD-10-CM | POA: Diagnosis not present

## 2023-06-14 DIAGNOSIS — E782 Mixed hyperlipidemia: Secondary | ICD-10-CM | POA: Diagnosis not present

## 2023-07-02 DIAGNOSIS — I480 Paroxysmal atrial fibrillation: Secondary | ICD-10-CM | POA: Diagnosis not present

## 2023-07-02 DIAGNOSIS — Z951 Presence of aortocoronary bypass graft: Secondary | ICD-10-CM | POA: Diagnosis not present

## 2023-07-05 DIAGNOSIS — E782 Mixed hyperlipidemia: Secondary | ICD-10-CM | POA: Diagnosis not present

## 2023-07-05 DIAGNOSIS — Z951 Presence of aortocoronary bypass graft: Secondary | ICD-10-CM | POA: Diagnosis not present

## 2023-07-05 DIAGNOSIS — I480 Paroxysmal atrial fibrillation: Secondary | ICD-10-CM | POA: Diagnosis not present

## 2023-07-05 DIAGNOSIS — I25810 Atherosclerosis of coronary artery bypass graft(s) without angina pectoris: Secondary | ICD-10-CM | POA: Diagnosis not present

## 2023-07-05 DIAGNOSIS — Z01818 Encounter for other preprocedural examination: Secondary | ICD-10-CM | POA: Diagnosis not present

## 2023-07-05 DIAGNOSIS — I10 Essential (primary) hypertension: Secondary | ICD-10-CM | POA: Diagnosis not present

## 2023-07-10 ENCOUNTER — Encounter: Payer: Self-pay | Admitting: Internal Medicine

## 2023-07-10 ENCOUNTER — Ambulatory Visit
Admission: RE | Admit: 2023-07-10 | Discharge: 2023-07-10 | Disposition: A | Discharge status: Home / Self Care | Payer: Medicare HMO | Attending: Internal Medicine | Admitting: Internal Medicine

## 2023-07-10 ENCOUNTER — Encounter
Admission: RE | Disposition: A | Discharge status: Home / Self Care | Payer: Self-pay | Source: Home / Self Care | Attending: Internal Medicine

## 2023-07-10 ENCOUNTER — Other Ambulatory Visit: Payer: Self-pay

## 2023-07-10 DIAGNOSIS — I2510 Atherosclerotic heart disease of native coronary artery without angina pectoris: Secondary | ICD-10-CM | POA: Insufficient documentation

## 2023-07-10 DIAGNOSIS — I2582 Chronic total occlusion of coronary artery: Secondary | ICD-10-CM | POA: Insufficient documentation

## 2023-07-10 DIAGNOSIS — I2584 Coronary atherosclerosis due to calcified coronary lesion: Secondary | ICD-10-CM | POA: Insufficient documentation

## 2023-07-10 DIAGNOSIS — R9430 Abnormal result of cardiovascular function study, unspecified: Secondary | ICD-10-CM

## 2023-07-10 DIAGNOSIS — Z951 Presence of aortocoronary bypass graft: Secondary | ICD-10-CM | POA: Insufficient documentation

## 2023-07-10 HISTORY — PX: LEFT HEART CATH AND CORS/GRAFTS ANGIOGRAPHY: CATH118250

## 2023-07-10 LAB — GLUCOSE, CAPILLARY
Glucose-Capillary: 120 mg/dL — ABNORMAL HIGH (ref 70–99)
Glucose-Capillary: 57 mg/dL — ABNORMAL LOW (ref 70–99)
Glucose-Capillary: 104 mg/dL — ABNORMAL HIGH (ref 70–99)

## 2023-07-10 SURGERY — LEFT HEART CATH AND CORS/GRAFTS ANGIOGRAPHY
Anesthesia: Moderate Sedation

## 2023-07-10 MED ORDER — SODIUM CHLORIDE 0.9 % WEIGHT BASED INFUSION: 1.0000 mL/kg/h | INTRAVENOUS | Status: DC

## 2023-07-10 MED ORDER — LABETALOL HCL 5 MG/ML IV SOLN: 10.0000 mg | INTRAVENOUS | Status: DC | PRN

## 2023-07-10 MED ORDER — SODIUM CHLORIDE 0.9 % WEIGHT BASED INFUSION: 3.0000 mL/kg/h | INTRAVENOUS | Status: AC

## 2023-07-10 MED ORDER — IOHEXOL 300 MG/ML  SOLN
INTRAMUSCULAR | Status: DC | PRN
  Administered 2023-07-10: 102 mL

## 2023-07-10 MED ORDER — MIDAZOLAM HCL 2 MG/2ML IJ SOLN
INTRAMUSCULAR | Status: DC | PRN
  Administered 2023-07-10: 1 mg via INTRAVENOUS

## 2023-07-10 MED ORDER — HEPARIN (PORCINE) IN NACL 2000-0.9 UNIT/L-% IV SOLN
INTRAVENOUS | Status: DC | PRN
  Administered 2023-07-10: 1000 mL

## 2023-07-10 MED ORDER — ONDANSETRON HCL 4 MG/2ML IJ SOLN: 4.0000 mg | Freq: Four times a day (QID) | INTRAMUSCULAR | Status: DC | PRN

## 2023-07-10 MED ORDER — MIDAZOLAM HCL 2 MG/2ML IJ SOLN
INTRAMUSCULAR | Status: AC
  Filled 2023-07-10: qty 2

## 2023-07-10 MED ORDER — HEPARIN (PORCINE) IN NACL 1000-0.9 UT/500ML-% IV SOLN
INTRAVENOUS | Status: AC
  Filled 2023-07-10: qty 1000

## 2023-07-10 MED ORDER — FENTANYL CITRATE (PF) 100 MCG/2ML IJ SOLN
INTRAMUSCULAR | Status: DC | PRN
  Administered 2023-07-10: 25 ug via INTRAVENOUS

## 2023-07-10 MED ORDER — FENTANYL CITRATE (PF) 100 MCG/2ML IJ SOLN
INTRAMUSCULAR | Status: AC
  Filled 2023-07-10: qty 2

## 2023-07-10 MED ORDER — ACETAMINOPHEN 325 MG PO TABS: 650.0000 mg | ORAL_TABLET | ORAL | Status: DC | PRN

## 2023-07-10 MED ORDER — SODIUM CHLORIDE 0.9% FLUSH: 3.0000 mL | Freq: Two times a day (BID) | INTRAVENOUS | Status: DC

## 2023-07-10 MED ORDER — SODIUM CHLORIDE 0.9 % WEIGHT BASED INFUSION
3.0000 mL/kg/h | INTRAVENOUS | Status: DC
  Administered 2023-07-10: 3 mL/kg/h via INTRAVENOUS

## 2023-07-10 MED ORDER — ASPIRIN 81 MG PO CHEW: 81.0000 mg | CHEWABLE_TABLET | ORAL | Status: DC

## 2023-07-10 MED ORDER — LIDOCAINE HCL 1 % IJ SOLN
INTRAMUSCULAR | Status: AC
  Filled 2023-07-10: qty 20

## 2023-07-10 MED ORDER — SODIUM CHLORIDE 0.9 % IV SOLN: 250.0000 mL | INTRAVENOUS | Status: DC | PRN

## 2023-07-10 MED ORDER — SODIUM CHLORIDE 0.9% FLUSH: 3.0000 mL | INTRAVENOUS | Status: DC | PRN

## 2023-07-10 MED ORDER — HYDRALAZINE HCL 20 MG/ML IJ SOLN: 10.0000 mg | INTRAMUSCULAR | Status: DC | PRN

## 2023-07-10 SURGICAL SUPPLY — 12 items
CATH INFINITI 5 FR MPA2 (CATHETERS) IMPLANT
CATH INFINITI 5FR MULTPACK ANG (CATHETERS) IMPLANT
DEVICE CLOSURE MYNXGRIP 5F (Vascular Products) IMPLANT
DRAPE BRACHIAL (DRAPES) IMPLANT
NDL PERC 18GX7CM (NEEDLE) IMPLANT
NEEDLE PERC 18GX7CM (NEEDLE) ×1 IMPLANT
PACK CARDIAC CATH (CUSTOM PROCEDURE TRAY) ×1 IMPLANT
PROTECTION STATION PRESSURIZED (MISCELLANEOUS) ×1
SET ATX-X65L (MISCELLANEOUS) IMPLANT
SHEATH AVANTI 5FR X 11CM (SHEATH) IMPLANT
STATION PROTECTION PRESSURIZED (MISCELLANEOUS) IMPLANT
WIRE EMERALD 3MM-J .035X150CM (WIRE) IMPLANT

## 2023-07-11 LAB — CARDIAC CATHETERIZATION: Cath EF Quantitative: 60 %

## 2023-07-12 ENCOUNTER — Encounter: Payer: Self-pay | Admitting: Internal Medicine

## 2023-07-18 DIAGNOSIS — I25810 Atherosclerosis of coronary artery bypass graft(s) without angina pectoris: Secondary | ICD-10-CM | POA: Diagnosis not present

## 2023-07-18 DIAGNOSIS — E119 Type 2 diabetes mellitus without complications: Secondary | ICD-10-CM | POA: Diagnosis not present

## 2023-07-18 DIAGNOSIS — Z01818 Encounter for other preprocedural examination: Secondary | ICD-10-CM | POA: Diagnosis not present

## 2023-07-18 DIAGNOSIS — Z951 Presence of aortocoronary bypass graft: Secondary | ICD-10-CM | POA: Diagnosis not present

## 2023-07-18 DIAGNOSIS — Z7902 Long term (current) use of antithrombotics/antiplatelets: Secondary | ICD-10-CM | POA: Diagnosis not present

## 2023-07-18 DIAGNOSIS — I480 Paroxysmal atrial fibrillation: Secondary | ICD-10-CM | POA: Diagnosis not present

## 2023-07-18 DIAGNOSIS — I10 Essential (primary) hypertension: Secondary | ICD-10-CM | POA: Diagnosis not present

## 2023-07-18 DIAGNOSIS — E782 Mixed hyperlipidemia: Secondary | ICD-10-CM | POA: Diagnosis not present

## 2023-07-31 DIAGNOSIS — R748 Abnormal levels of other serum enzymes: Secondary | ICD-10-CM | POA: Diagnosis not present

## 2023-07-31 DIAGNOSIS — E119 Type 2 diabetes mellitus without complications: Secondary | ICD-10-CM | POA: Diagnosis not present

## 2023-07-31 DIAGNOSIS — I10 Essential (primary) hypertension: Secondary | ICD-10-CM | POA: Diagnosis not present

## 2023-08-07 DIAGNOSIS — I480 Paroxysmal atrial fibrillation: Secondary | ICD-10-CM | POA: Diagnosis not present

## 2023-08-07 DIAGNOSIS — I10 Essential (primary) hypertension: Secondary | ICD-10-CM | POA: Diagnosis not present

## 2023-08-07 DIAGNOSIS — E119 Type 2 diabetes mellitus without complications: Secondary | ICD-10-CM | POA: Diagnosis not present

## 2023-08-07 DIAGNOSIS — E785 Hyperlipidemia, unspecified: Secondary | ICD-10-CM | POA: Diagnosis not present

## 2023-08-07 DIAGNOSIS — Z951 Presence of aortocoronary bypass graft: Secondary | ICD-10-CM | POA: Diagnosis not present

## 2023-09-24 DIAGNOSIS — H16223 Keratoconjunctivitis sicca, not specified as Sjogren's, bilateral: Secondary | ICD-10-CM | POA: Diagnosis not present

## 2023-09-24 DIAGNOSIS — H35033 Hypertensive retinopathy, bilateral: Secondary | ICD-10-CM | POA: Diagnosis not present

## 2023-09-24 DIAGNOSIS — H26493 Other secondary cataract, bilateral: Secondary | ICD-10-CM | POA: Diagnosis not present

## 2023-09-24 DIAGNOSIS — E119 Type 2 diabetes mellitus without complications: Secondary | ICD-10-CM | POA: Diagnosis not present

## 2023-09-24 DIAGNOSIS — I10 Essential (primary) hypertension: Secondary | ICD-10-CM | POA: Diagnosis not present

## 2023-09-24 DIAGNOSIS — H524 Presbyopia: Secondary | ICD-10-CM | POA: Diagnosis not present

## 2023-10-03 ENCOUNTER — Encounter: Payer: Self-pay | Admitting: Podiatry

## 2023-10-03 ENCOUNTER — Ambulatory Visit: Payer: Medicare HMO | Admitting: Podiatry

## 2023-10-03 DIAGNOSIS — E119 Type 2 diabetes mellitus without complications: Secondary | ICD-10-CM

## 2023-10-03 DIAGNOSIS — M722 Plantar fascial fibromatosis: Secondary | ICD-10-CM | POA: Diagnosis not present

## 2023-10-03 NOTE — Progress Notes (Signed)
  Subjective:  Patient ID: Joe [name], male    DOB: May 17, 1951,  MRN: [id number]  Chief Complaint  Patient presents with   Diabetes    "I'm here for an annual check."    72 y.o. male presents with the above complaint. History confirmed with patient.  He is doing well his plantar fibromas remain asymptomatic he reports improvement in his blood sugar control and his A1c is normal 7%.  Cyst on the right second toe is no longer a problem.  Objective:  Physical Exam: warm, good capillary refill, no trophic changes or ulcerative lesions, normal DP and PT pulses, normal monofilament exam, and normal sensory exam. Left Foot:  Nontender plantar fibromas medial band Right Foot: No abnormalities  Assessment:   1. Encounter for diabetic foot exam ([facility])   2. Type 2 diabetes mellitus without complication, without long-term current use of insulin ([facility])   3. Plantar fibromatosis      Plan:  Patient was evaluated and treated and all questions answered.  Patient educated on diabetes. Discussed proper diabetic foot care and discussed risks and complications of disease. Educated patient in depth on reasons to return to the office immediately should he/she discover anything concerning or new on the feet. All questions answered. Discussed proper shoes as well.  Overall low risk complications he has good perfusion and no evidence of neuropathy, his A1c is now well-controlled.   .  Plantar fibromas remain asymptomatic he will let me know if they become a problem  Return in about 1 year (around 10/02/2024) for diabetic foot examination .

## 2023-10-08 ENCOUNTER — Ambulatory Visit: Payer: Medicare HMO | Admitting: Podiatry

## 2024-01-07 DIAGNOSIS — M7989 Other specified soft tissue disorders: Secondary | ICD-10-CM | POA: Diagnosis not present

## 2024-01-07 DIAGNOSIS — M62830 Muscle spasm of back: Secondary | ICD-10-CM | POA: Diagnosis not present

## 2024-01-17 ENCOUNTER — Ambulatory Visit: Admitting: Podiatry

## 2024-01-23 ENCOUNTER — Ambulatory Visit (INDEPENDENT_AMBULATORY_CARE_PROVIDER_SITE_OTHER)

## 2024-01-23 ENCOUNTER — Ambulatory Visit: Admitting: Podiatry

## 2024-01-23 ENCOUNTER — Encounter: Payer: Self-pay | Admitting: Podiatry

## 2024-01-23 DIAGNOSIS — M2041 Other hammer toe(s) (acquired), right foot: Secondary | ICD-10-CM

## 2024-01-23 DIAGNOSIS — R600 Localized edema: Secondary | ICD-10-CM

## 2024-01-23 DIAGNOSIS — M2042 Other hammer toe(s) (acquired), left foot: Secondary | ICD-10-CM

## 2024-01-23 NOTE — Progress Notes (Signed)
 Subjective:  Patient ID: Joe [name], male    DOB: [birth year],  MRN: [id number]  Chief Complaint  Patient presents with   Foot Swelling    "I have swelling in both my feet, they're swollen all over."    Discussed the use of AI scribe software for clinical note transcription with the patient, who gave verbal consent to proceed.  History of Present Illness Joe [name] is a 73 year old male who presents with bilateral ankle swelling and intermittent pain. He is accompanied by his wife, who is recovering from a leg fracture.  He experiences bilateral ankle swelling and intermittent pain, which began after he started sleeping in a recliner. The swelling is more pronounced after several nights in this position, and he has never experienced swollen feet prior to this incident. He attributes some discomfort to an incident where a transfer board slipped and hit his foot while assisting his wife.  He has a history of back pain dating back to the 1970s, which flares up with certain movements. Recently, increased physical activity related to moving furniture and setting up a handicap ramp for his wife may have exacerbated his symptoms.  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 He acknowledges not drinking enough water, which he recognizes as important for kidney function and fluid movement. No major arthritis or pain is noted, and he does not add much salt to his food.      Objective:    Physical Exam VASCULAR: DP and PT pulse palpable. Foot is warm and well-perfused. Capillary fill time is brisk. 1+ pitting edema, mild varicosities, hemocytosis in lower leg. DERMATOLOGIC: Normal skin turgor texture and temperature. No open lesions or rashes or ulcerations. NEUROLOGIC: Normal sensation to light touch and pressure. No paresthesias on  examination. ORTHOPEDIC: Smooth pain-free range of motion of all examined joints. No ecchymosis or bruising.  Reducible contractures of the toes. No pain to palpation. Foot non-tender.   No images are attached to the encounter.    Results  Radiographs of both feet and ankle today showed no acute osseous abnormality no major degenerative changes mild digital contractures   Assessment:   1. Localized edema   2. Hammertoe of right foot   3. Hammertoe of left foot      Plan:  Patient was evaluated and treated and all questions answered.  Assessment and Plan Assessment & Plan Lower extremity edema New onset swelling in feet and ankles, likely due to prolonged sitting in a dependent position while caring for his wife. Differential diagnosis includes venous insufficiency, cardiac issues, renal function, and dietary factors. Mild varicosities and hemocytosis noted in the lower leg. - Elevate feet above heart level to reduce swelling. - Engage in walking for exercise to help reduce edema. - Use compression stockings to prevent swelling. - Adopt a low sodium diet to manage fluid retention. - Increase water intake to support kidney function and fluid movement. - Monitor for any increase in pain or swelling and return if symptoms worsen.  Chronic back pain Back pain since the 1970s, exacerbated by activities such as twisting or heavy lifting. Recent flare-up likely due to increased physical activity while assisting his wife and moving furniture. - Rest and avoid activities that exacerbate back pain. - Gradual return to normal activities as tolerated.  Follow-up Monitor symptoms and return if  there is any worsening of swelling or pain. - Send a letter to the primary care physician to update on the current condition. - Return if symptoms worsen or if there are any new concerns.      Return if symptoms worsen or fail to improve.

## 2024-01-23 NOTE — Patient Instructions (Signed)
 VISIT SUMMARY:  Today, you were seen for swelling in your ankles and intermittent pain, which started after you began sleeping in a recliner. You also have a history of back pain that has recently flared up due to increased physical activity while assisting your wife.  YOUR PLAN:  -LOWER EXTREMITY EDEMA: This means swelling in your feet and ankles. It is likely due to prolonged sitting in a recliner while caring for your wife. To manage this, you should elevate your feet above heart level, engage in walking for exercise, use compression stockings, adopt a low sodium diet, and increase your water intake. Monitor for any increase in pain or swelling and return if symptoms worsen.  -CHRONIC BACK PAIN: This refers to long-term back pain that can flare up with certain activities. Your recent flare-up is likely due to increased physical activity. To manage this, rest and avoid activities that worsen the pain, and gradually return to normal activities as tolerated.  INSTRUCTIONS:  Monitor your symptoms and return if there is any worsening of swelling or pain. A letter will be sent to your primary care physician to update them on your current condition. Please return if your symptoms worsen or if you have any new concerns.

## 2024-02-11 DIAGNOSIS — E119 Type 2 diabetes mellitus without complications: Secondary | ICD-10-CM | POA: Diagnosis not present

## 2024-02-11 DIAGNOSIS — E782 Mixed hyperlipidemia: Secondary | ICD-10-CM | POA: Diagnosis not present

## 2024-02-11 DIAGNOSIS — Z125 Encounter for screening for malignant neoplasm of prostate: Secondary | ICD-10-CM | POA: Diagnosis not present

## 2024-02-18 DIAGNOSIS — I10 Essential (primary) hypertension: Secondary | ICD-10-CM | POA: Diagnosis not present

## 2024-02-18 DIAGNOSIS — E785 Hyperlipidemia, unspecified: Secondary | ICD-10-CM | POA: Diagnosis not present

## 2024-02-18 DIAGNOSIS — Z951 Presence of aortocoronary bypass graft: Secondary | ICD-10-CM | POA: Diagnosis not present

## 2024-02-18 DIAGNOSIS — Z1331 Encounter for screening for depression: Secondary | ICD-10-CM | POA: Diagnosis not present

## 2024-02-18 DIAGNOSIS — I480 Paroxysmal atrial fibrillation: Secondary | ICD-10-CM | POA: Diagnosis not present

## 2024-02-18 DIAGNOSIS — R202 Paresthesia of skin: Secondary | ICD-10-CM | POA: Diagnosis not present

## 2024-02-18 DIAGNOSIS — M7989 Other specified soft tissue disorders: Secondary | ICD-10-CM | POA: Diagnosis not present

## 2024-02-18 DIAGNOSIS — Z0000 Encounter for general adult medical examination without abnormal findings: Secondary | ICD-10-CM | POA: Diagnosis not present

## 2024-02-18 DIAGNOSIS — E119 Type 2 diabetes mellitus without complications: Secondary | ICD-10-CM | POA: Diagnosis not present

## 2024-02-27 DIAGNOSIS — R202 Paresthesia of skin: Secondary | ICD-10-CM | POA: Diagnosis not present

## 2024-02-27 DIAGNOSIS — I10 Essential (primary) hypertension: Secondary | ICD-10-CM | POA: Diagnosis not present

## 2024-02-27 DIAGNOSIS — R7989 Other specified abnormal findings of blood chemistry: Secondary | ICD-10-CM | POA: Diagnosis not present

## 2024-02-27 DIAGNOSIS — E291 Testicular hypofunction: Secondary | ICD-10-CM | POA: Diagnosis not present

## 2024-06-17 DIAGNOSIS — M8589 Other specified disorders of bone density and structure, multiple sites: Secondary | ICD-10-CM | POA: Diagnosis not present

## 2024-07-21 DIAGNOSIS — E119 Type 2 diabetes mellitus without complications: Secondary | ICD-10-CM | POA: Diagnosis not present

## 2024-07-21 DIAGNOSIS — E782 Mixed hyperlipidemia: Secondary | ICD-10-CM | POA: Diagnosis not present

## 2024-07-21 DIAGNOSIS — I480 Paroxysmal atrial fibrillation: Secondary | ICD-10-CM | POA: Diagnosis not present

## 2024-07-21 DIAGNOSIS — Z951 Presence of aortocoronary bypass graft: Secondary | ICD-10-CM | POA: Diagnosis not present

## 2024-07-21 DIAGNOSIS — I25810 Atherosclerosis of coronary artery bypass graft(s) without angina pectoris: Secondary | ICD-10-CM | POA: Diagnosis not present

## 2024-07-21 DIAGNOSIS — I10 Essential (primary) hypertension: Secondary | ICD-10-CM | POA: Diagnosis not present

## 2024-08-05 DIAGNOSIS — H35033 Hypertensive retinopathy, bilateral: Secondary | ICD-10-CM | POA: Diagnosis not present

## 2024-08-05 DIAGNOSIS — H16223 Keratoconjunctivitis sicca, not specified as Sjogren's, bilateral: Secondary | ICD-10-CM | POA: Diagnosis not present

## 2024-08-05 DIAGNOSIS — H524 Presbyopia: Secondary | ICD-10-CM | POA: Diagnosis not present

## 2024-08-05 DIAGNOSIS — H53021 Refractive amblyopia, right eye: Secondary | ICD-10-CM | POA: Diagnosis not present

## 2024-08-05 DIAGNOSIS — H26493 Other secondary cataract, bilateral: Secondary | ICD-10-CM | POA: Diagnosis not present

## 2024-08-05 DIAGNOSIS — E119 Type 2 diabetes mellitus without complications: Secondary | ICD-10-CM | POA: Diagnosis not present

## 2024-08-05 DIAGNOSIS — I10 Essential (primary) hypertension: Secondary | ICD-10-CM | POA: Diagnosis not present

## 2024-08-11 DIAGNOSIS — Z0100 Encounter for examination of eyes and vision without abnormal findings: Secondary | ICD-10-CM | POA: Diagnosis not present

## 2024-08-20 ENCOUNTER — Ambulatory Visit: Admitting: Podiatry

## 2024-08-20 VITALS — Ht 72.0 in | Wt 205.0 lb

## 2024-08-20 DIAGNOSIS — Z0189 Encounter for other specified special examinations: Secondary | ICD-10-CM

## 2024-08-20 DIAGNOSIS — E119 Type 2 diabetes mellitus without complications: Secondary | ICD-10-CM

## 2024-08-20 DIAGNOSIS — R5383 Other fatigue: Secondary | ICD-10-CM | POA: Diagnosis not present

## 2024-08-20 DIAGNOSIS — R7989 Other specified abnormal findings of blood chemistry: Secondary | ICD-10-CM | POA: Diagnosis not present

## 2024-08-21 NOTE — Progress Notes (Signed)
  Subjective:  Patient ID: Joe [name], male    DOB: 1951-08-30,  MRN: [id number]  Chief Complaint  Patient presents with   Diabetes    Rm 8 DFC    73 y.o. male presents with the above complaint. History confirmed with patient.  He is doing well notes no new issues.  His plantar fibromas remain asymptomatic.  His A1c is well-controlled.  Objective:  Physical Exam: warm, good capillary refill, no trophic changes or ulcerative lesions, normal DP and PT pulses, normal monofilament exam, and normal sensory exam. Left Foot: Nontender plantar fibromas medial band Right Foot: No abnormalities  Assessment:   1. Encounter for diabetic foot exam [facility])      Plan:  Patient was evaluated and treated and all questions answered.  Patient educated on diabetes. Discussed proper diabetic foot care and discussed risks and complications of disease. Educated patient in depth on reasons to return to the office immediately should he/she discover anything concerning or new on the feet. All questions answered. Discussed proper shoes as well.  Overall low risk complications he has good perfusion and no evidence of neuropathy, his A1c is now well-controlled.   .  Plantar fibromas remain asymptomatic he will let me know if they become a problem  Return in about 1 year (around 08/20/2025) for annual diabetic foot exam .

## 2024-08-27 DIAGNOSIS — R7989 Other specified abnormal findings of blood chemistry: Secondary | ICD-10-CM | POA: Diagnosis not present

## 2024-08-27 DIAGNOSIS — I2510 Atherosclerotic heart disease of native coronary artery without angina pectoris: Secondary | ICD-10-CM | POA: Diagnosis not present

## 2024-08-27 DIAGNOSIS — I480 Paroxysmal atrial fibrillation: Secondary | ICD-10-CM | POA: Diagnosis not present

## 2024-08-27 DIAGNOSIS — I10 Essential (primary) hypertension: Secondary | ICD-10-CM | POA: Diagnosis not present

## 2024-08-27 DIAGNOSIS — E119 Type 2 diabetes mellitus without complications: Secondary | ICD-10-CM | POA: Diagnosis not present

## 2024-08-27 DIAGNOSIS — E785 Hyperlipidemia, unspecified: Secondary | ICD-10-CM | POA: Diagnosis not present

## 2024-08-27 DIAGNOSIS — Z23 Encounter for immunization: Secondary | ICD-10-CM | POA: Diagnosis not present

## 2024-08-27 DIAGNOSIS — M8580 Other specified disorders of bone density and structure, unspecified site: Secondary | ICD-10-CM | POA: Diagnosis not present

## 2024-10-01 ENCOUNTER — Ambulatory Visit: Payer: Medicare HMO | Admitting: Podiatry
